# Patient Record
Sex: Female | Born: 1965 | Hispanic: Yes | Marital: Single | Smoking: Never smoker
Health system: Southern US, Community
[De-identification: ages and names within clinical notes are randomized; demographics above are authoritative.]

## PROBLEM LIST (undated history)

## (undated) DIAGNOSIS — O039 Complete or unspecified spontaneous abortion without complication: Secondary | ICD-10-CM

## (undated) DIAGNOSIS — F32A Depression, unspecified: Secondary | ICD-10-CM

## (undated) DIAGNOSIS — G44209 Tension-type headache, unspecified, not intractable: Secondary | ICD-10-CM

## (undated) HISTORY — DX: Tension-type headache, unspecified, not intractable: G44.209

## (undated) HISTORY — DX: Depression, unspecified: F32.A

## (undated) HISTORY — PX: DILATION AND EVACUATION: SHX1459

## (undated) HISTORY — DX: Complete or unspecified spontaneous abortion without complication: O03.9

## (undated) HISTORY — PX: TOTAL ABDOMINAL HYSTERECTOMY: SHX209

---

## 2005-05-24 ENCOUNTER — Other Ambulatory Visit: Admission: RE | Admit: 2005-05-24 | Discharge: 2005-05-24 | Payer: Self-pay | Admitting: Gynecology

## 2005-11-26 HISTORY — PX: PARTIAL HYSTERECTOMY: SHX80

## 2006-05-23 ENCOUNTER — Other Ambulatory Visit: Admission: RE | Admit: 2006-05-23 | Discharge: 2006-05-23 | Payer: Self-pay | Admitting: Gynecology

## 2006-05-30 ENCOUNTER — Encounter (INDEPENDENT_AMBULATORY_CARE_PROVIDER_SITE_OTHER): Payer: Self-pay | Admitting: Specialist

## 2006-05-30 ENCOUNTER — Ambulatory Visit (HOSPITAL_COMMUNITY): Admission: RE | Admit: 2006-05-30 | Discharge: 2006-06-01 | Payer: Self-pay | Admitting: Gynecology

## 2006-07-22 ENCOUNTER — Ambulatory Visit (HOSPITAL_COMMUNITY): Admission: RE | Admit: 2006-07-22 | Discharge: 2006-07-22 | Payer: Self-pay | Admitting: Gynecology

## 2007-05-26 ENCOUNTER — Other Ambulatory Visit: Admission: RE | Admit: 2007-05-26 | Discharge: 2007-05-26 | Payer: Self-pay | Admitting: Gynecology

## 2007-07-24 ENCOUNTER — Ambulatory Visit (HOSPITAL_COMMUNITY): Admission: RE | Admit: 2007-07-24 | Discharge: 2007-07-24 | Payer: Self-pay | Admitting: Gynecology

## 2008-05-26 ENCOUNTER — Other Ambulatory Visit: Admission: RE | Admit: 2008-05-26 | Discharge: 2008-05-26 | Payer: Self-pay | Admitting: Gynecology

## 2008-07-26 ENCOUNTER — Ambulatory Visit (HOSPITAL_COMMUNITY): Admission: RE | Admit: 2008-07-26 | Discharge: 2008-07-26 | Payer: Self-pay | Admitting: Gynecology

## 2009-06-06 ENCOUNTER — Other Ambulatory Visit: Admission: RE | Admit: 2009-06-06 | Discharge: 2009-06-06 | Payer: Self-pay | Admitting: Gynecology

## 2009-06-06 ENCOUNTER — Encounter: Payer: Self-pay | Admitting: Gynecology

## 2009-06-06 ENCOUNTER — Ambulatory Visit: Payer: Self-pay | Admitting: Gynecology

## 2009-07-28 ENCOUNTER — Ambulatory Visit (HOSPITAL_COMMUNITY): Admission: RE | Admit: 2009-07-28 | Discharge: 2009-07-28 | Payer: Self-pay | Admitting: Gynecology

## 2010-06-07 ENCOUNTER — Other Ambulatory Visit: Admission: RE | Admit: 2010-06-07 | Discharge: 2010-06-07 | Payer: Self-pay | Admitting: Gynecology

## 2010-06-07 ENCOUNTER — Ambulatory Visit: Payer: Self-pay | Admitting: Gynecology

## 2010-08-01 ENCOUNTER — Ambulatory Visit (HOSPITAL_COMMUNITY): Admission: RE | Admit: 2010-08-01 | Discharge: 2010-08-01 | Payer: Self-pay | Admitting: Gynecology

## 2010-12-17 ENCOUNTER — Encounter: Payer: Self-pay | Admitting: Gynecology

## 2011-04-13 NOTE — Discharge Summary (Signed)
Elizabeth Hernandez, Elizabeth Hernandez              ACCOUNT NO.:  [id number]   MEDICAL RECORD NO.:  [id number]          PATIENT TYPE:  INP   LOCATION:  9318                          FACILITY:  [facility]   PHYSICIAN:  Juan H. Lily Peer, M.D.DATE OF BIRTH:  10-Oct-1966   DATE OF ADMISSION:  05/30/2006  DATE OF DISCHARGE:  06/01/2006                                 DISCHARGE SUMMARY   TOTAL DAYS HOSPITALIZED:  Two.   HISTORY:  The patient is a 45 year old gravida 3, para 2, ab 1 who on the  morning of July 5th underwent a total abdominal hysterectomy secondary to  symptomatic leiomyomatous uterus (dysmenorrhea, menorrhagia, pelvic pain).  The patient had minimal blood loss during her surgery, received 1300 mL of  lactated Ringer's, had a urine output of 200 mL intraoperatively and  received cefoxitin 2 gm IV.  The specimen, consisting of the uterus and  cervix, weighed 316.5 gm.  For postoperative day #1, her Foley catheter was  discontinued after she had an adequate urine output of more than 600 mL in  an 8-hour period.  Her T max was 98.7.  Hemoglobin and hematocrit were 11.1  and 32.9, respectively, with a platelet count of 246,000.  She was started  on a clear liquid diet.  Her Foley catheter was discontinued.  Her PCA pump  was discontinued.  She was started on p.o. narcotics and started on a clear  liquid diet.  By her second postoperative day, she had tolerated a full  liquid diet and was on a regular diet, was up and ambulating, voiding well,  at the time of this dictation was in the process of taking a shower and was  afebrile with stable vital signs and good bowel sounds.  Her incision was  intact, and she was ready to be discharged home.   FINAL DIAGNOSES:  Symptomatic leiomyomatous uterus.   PROCEDURE PERFORMED:  Total abdominal hysterectomy.   FINAL DISPOSITION/FOLLOWUP:  The patient was discharged home on her second  postoperative day.  She was up, ambulating and tolerating a regular diet  well with stable vital signs and afebrile.  The patient was given a  prescription of Lortab 7.5/500 mg to take 1 p.o. q.4-6h. p.r.n. pain along  with Reglan 10 mg 1 p.o. q.4-6h. p.r.n. nausea or vomiting.  She was to  return back to the office the early part of next week to have her staples  removed.  The instructions were provided in Spanish.  All questions were  answered.  We will follow accordingly.      Juan H. Lily Peer, M.D.  Electronically Signed     JHF/MEDQ  D:  06/01/2006  T:  06/01/2006  Job:  [id number]

## 2011-04-13 NOTE — Op Note (Signed)
Elizabeth Hernandez, Elizabeth Hernandez              ACCOUNT NO.:  [id number]   MEDICAL RECORD NO.:  [id number]          PATIENT TYPE:  AMB   LOCATION:  SDC                           FACILITY:  [facility]   PHYSICIAN:  Juan H. Lily Peer, M.D.DATE OF BIRTH:  09-Feb-1966   DATE OF PROCEDURE:  05/30/2006  DATE OF DISCHARGE:                                 OPERATIVE REPORT   INDICATIONS FOR OPERATION:  45 year old gravida 3, para 2, ab 1 with  symptomatic leiomyomata uteri consisting of dysmenorrhea, menorrhagia, and  pelvic pressure.   PREOPERATIVE DIAGNOSES:  1.  Leiomyomata uteri.  2.  Dysmenorrhea.  3.  Menorrhagia.   POSTOPERATIVE DIAGNOSES:  1.  Leiomyomata uteri.  2.  Dysmenorrhea.  3.  Menorrhagia.   ANESTHESIA:  General endotracheal anesthesia.   PROCEDURE PERFORMED:  Total abdominal hysterectomy.   SURGEON:  Juan H. Lily Peer, M.D.   FIRST ASSISTANT:  Ivor Costa. Lathrop, M.D.   DESCRIPTION OF OPERATION:  After the patient adequately counseled, she was  taken to the operating where she underwent successful general endotracheal  anesthesia. Her abdomen and perineum were prepped and draped in the usual  sterile fashion.  90.25% Marcaine was infiltrated in the planned  Pfannenstiel incision site for total 10 mL.  An incision then was made 2 cm  above the symphysis pubis.  The incision was carried down through the skin,  subcutaneous tissue, down to the rectus fascia where a midline nick was  made.  The fascia was incised in transverse fashion.  The midline raphe was  entered.  The peritoneal cavity was entered cautiously.  The patient was  placed in Trendelenburg position and O'Connor-O'Sullivan retractors were in  place.  Two Heaney clamps were utilized to grasp the uterus on both sides.  It was noted that the patient had an approximately 10 to 11 weeks' size  uterus.  Questionable endometriotic implant the posterior lower uterine  segment.  Both tubes and ovary appeared to be normal otherwise.   The right  round ligament was identified, was cauterized transected.  The bladder flap  was established.  The posterior broad ligament was then penetrated with a  surgeon's finger and a Heaney clamp was used to grasp the utero-ovarian  ligament and fallopian tube proximal to the uterus and it was cut and the  right infundibulopelvic ligament had been secured with 0 Vicryl suture  followed by transfixation stitch.  The remaining parametrium consisting of  the cardinal and broad ligament were serially clamped, cut and suture-  ligated with 0 Vicryl suture to the area of the right vaginal fornix.  Similar procedure was carried out on the contralateral side.  Once this was  accomplished, two curved Heaney clamps were placed at the cervicovaginal  junction.  With the Jorgenson scissors, the cervix was excised from the  vagina and cervix and uterus was passed off the operative field.  The  specimen weighed 316.5 grams.  Both angles were secured with a transfixation  stitch of 0 Vicryl suture and the remaining vaginal cuff was secured with  interrupted sutures of 0 Vicryl suture.  Both ovaries were  individually  suspended to the round ligament with 3-0 Vicryl suture.  The pelvic cavity  was copiously irrigated with normal saline solution and sponge and needle  count were correct.  The patient was reversed from her Trendelenburg  position and the O'Connor-O'Sullivan retractor was removed.  The visceral  peritoneum was not reapproximated but the rectus fascia was closed with  running stitch of zero Vicryl suture.  Subcutaneous bleeders were Bovie  cauterized.  Skin was reapproximated with skin clips followed by placing  Xeroform gauze and 4 x 8 dressing.  The patient was extubated, transferred  to recovery with stable vital signs.  Blood loss from procedure was 50 mL.  IV fluids 1300 mL of lactated Ringer.  Urine output was 200 mL and she  received 2 grams of cefoxitin and the patient also had PSA  stockings for DVT  prophylaxis.      Juan H. Lily Peer, M.D.  Electronically Signed     JHF/MEDQ  D:  05/30/2006  T:  05/30/2006  Job:  [id number]

## 2011-05-31 ENCOUNTER — Encounter: Payer: Self-pay | Admitting: *Deleted

## 2011-06-11 ENCOUNTER — Other Ambulatory Visit (HOSPITAL_COMMUNITY)
Admission: RE | Admit: 2011-06-11 | Discharge: 2011-06-11 | Disposition: A | Discharge status: Home / Self Care | Payer: 59 | Source: Ambulatory Visit | Attending: Gynecology | Admitting: Gynecology

## 2011-06-11 ENCOUNTER — Other Ambulatory Visit: Payer: Self-pay | Admitting: Gynecology

## 2011-06-11 ENCOUNTER — Encounter (INDEPENDENT_AMBULATORY_CARE_PROVIDER_SITE_OTHER): Payer: 59 | Admitting: Gynecology

## 2011-06-11 ENCOUNTER — Encounter: Payer: Self-pay | Admitting: Gynecology

## 2011-06-11 DIAGNOSIS — Z833 Family history of diabetes mellitus: Secondary | ICD-10-CM

## 2011-06-11 DIAGNOSIS — Z01419 Encounter for gynecological examination (general) (routine) without abnormal findings: Secondary | ICD-10-CM

## 2011-06-11 DIAGNOSIS — R635 Abnormal weight gain: Secondary | ICD-10-CM

## 2011-06-11 DIAGNOSIS — B373 Candidiasis of vulva and vagina: Secondary | ICD-10-CM

## 2011-06-11 DIAGNOSIS — Z124 Encounter for screening for malignant neoplasm of cervix: Secondary | ICD-10-CM | POA: Insufficient documentation

## 2011-06-11 DIAGNOSIS — Z13220 Encounter for screening for lipoid disorders: Secondary | ICD-10-CM

## 2011-06-15 ENCOUNTER — Other Ambulatory Visit: Payer: Self-pay | Admitting: Gynecology

## 2011-06-15 DIAGNOSIS — Z1231 Encounter for screening mammogram for malignant neoplasm of breast: Secondary | ICD-10-CM

## 2011-06-19 ENCOUNTER — Telehealth: Payer: Self-pay | Admitting: Anesthesiology

## 2011-06-19 NOTE — Telephone Encounter (Signed)
I called patient today and informed that her pap smear report showed yeast infection was present.Marland Kitchen And wanted to call in a Rx for the infection but she said that Dr. Lily Peer had provided a Rx for diflucan the day of her visit and she felt ok and didn't feel she needed another refill.. All was explained in spanish and advised to call back with any other questions.Marland KitchenMarland Kitchen

## 2011-08-03 ENCOUNTER — Ambulatory Visit (HOSPITAL_COMMUNITY)
Admission: RE | Admit: 2011-08-03 | Discharge: 2011-08-03 | Disposition: A | Discharge status: Home / Self Care | Payer: 59 | Source: Ambulatory Visit | Attending: Gynecology | Admitting: Gynecology

## 2011-08-03 DIAGNOSIS — Z1231 Encounter for screening mammogram for malignant neoplasm of breast: Secondary | ICD-10-CM

## 2011-12-18 ENCOUNTER — Ambulatory Visit (INDEPENDENT_AMBULATORY_CARE_PROVIDER_SITE_OTHER): Payer: 59

## 2011-12-18 DIAGNOSIS — J019 Acute sinusitis, unspecified: Secondary | ICD-10-CM

## 2012-06-11 ENCOUNTER — Encounter: Payer: Self-pay | Admitting: Gynecology

## 2012-06-11 ENCOUNTER — Ambulatory Visit (INDEPENDENT_AMBULATORY_CARE_PROVIDER_SITE_OTHER): Payer: 59 | Admitting: Gynecology

## 2012-06-11 VITALS — BP 116/74 | Ht 60.0 in | Wt 169.0 lb

## 2012-06-11 DIAGNOSIS — N898 Other specified noninflammatory disorders of vagina: Secondary | ICD-10-CM

## 2012-06-11 DIAGNOSIS — R635 Abnormal weight gain: Secondary | ICD-10-CM

## 2012-06-11 DIAGNOSIS — Z01419 Encounter for gynecological examination (general) (routine) without abnormal findings: Secondary | ICD-10-CM

## 2012-06-11 DIAGNOSIS — L293 Anogenital pruritus, unspecified: Secondary | ICD-10-CM

## 2012-06-11 DIAGNOSIS — Z833 Family history of diabetes mellitus: Secondary | ICD-10-CM

## 2012-06-11 LAB — CBC WITH DIFFERENTIAL/PLATELET
Basophils Absolute: 0 10*3/uL (ref 0.0–0.1)
Basophils Relative: 0 % (ref 0–1)
Eosinophils Absolute: 0.2 10*3/uL (ref 0.0–0.7)
MCH: 30.4 pg (ref 26.0–34.0)
MCHC: 33.7 g/dL (ref 30.0–36.0)
Neutrophils Relative %: 51 % (ref 43–77)
Platelets: 263 10*3/uL (ref 150–400)
RBC: 4.21 MIL/uL (ref 3.87–5.11)
RDW: 14.9 % (ref 11.5–15.5)

## 2012-06-11 LAB — CHOLESTEROL, TOTAL: Cholesterol: 163 mg/dL (ref 0–200)

## 2012-06-11 LAB — WET PREP FOR TRICH, YEAST, CLUE
Clue Cells Wet Prep HPF POC: NONE SEEN
Trich, Wet Prep: NONE SEEN
WBC, Wet Prep HPF POC: NONE SEEN
Yeast Wet Prep HPF POC: NONE SEEN

## 2012-06-11 LAB — HEMOGLOBIN A1C: Hgb A1c MFr Bld: 5.8 % — ABNORMAL HIGH (ref <5.7)

## 2012-06-11 NOTE — Patient Instructions (Addendum)
Control del colesterol  Los niveles de colesterol en el organismo estn determinados significativamente por su dieta. Los niveles de colesterol tambin se relacionan con la enfermedad cardaca. El material que sigue ayuda a Software engineer relacin y a Chiropractor qu puede hacer para mantener su corazn sano. No todo el colesterol es [place]. Las lipoprotenas de baja densidad (LDL) forman el colesterol "malo". El colesterol malo puede ocasionar depsitos de grasa que se acumulan en el interior de las arterias. Las lipoprotenas de alta densidad (HDL) es el colesterol "bueno". Ayuda a remover el colesterol LDL "malo" de la [place]. El colesterol es un factor de riesgo muy importante para la enfermedad cardaca. Otros factores de riesgo son la hipertensin arterial, el hbito de fumar, el estrs, la herencia y [place].  El msculo cardaco obtiene el suministro de sangre a travs de las arterias coronarias. Si su colesterol LDL ("malo") est elevado y el HDL ("bueno") es bajo, tiene un factor de riesgo para que se formen depsitos de Holiday representative en las arterias coronarias (los vasos sanguneos que suministran sangre al corazn). Esto hace que haya menos lugar para que la sangre circule. Sin la suficiente sangre y oxgeno, el msculo cardaco no puede funcionar correctamente, y usted podr sentir dolores en el pecho (angina pectoris). Cuando una arteria coronaria se cierra completamente, una parte del msculo cardaco puede morir (infarto de miocardio). CONTROL DEL COLESTEROL Cuando el profesional que lo asiste enva la sangre al laboratorio para Artist nivel de colesterol, puede realizarle tambin un perfil completo de los lpidos. Con esta prueba, se puede determinar la cantidad total de colesterol, as como los niveles de LDL y HDL. Los triglicridos son un tipo de grasa que circula en la sangre y que tambin puede utilizarse para determinar el riesgo de enfermedad  cardaca. En la siguiente tabla se establecen los nmeros ideales: Prueba: Colesterol total  Menos de 200 mg/dl.  Prueba: LDL "colesterol malo"  Menos de 100 mg/dl.   Menos de 70 mg/dl si tiene riesgo muy elevado de sufrir un ataque cardaco o muerte cardaca sbita.  Prueba: HDL "colesterol bueno"  Mujeres: Ms de 50 mg/dl.   Hombres: Ms de 40 mg/dl.  Prueba: Trigliceridos  Menos de 150 mg/dl.  CONTROL DEL COLESTEROL CON DIETA Aunque factores como el ejercicio y el estilo de vida son importantes, la "primera lnea de ataque" es la dieta. Esto se debe a que se sabe que ciertos alimentos hacen subir el colesterol y otros lo [place]. El objetivo debe ser ConAgra Foods alimentos, de modo que tengan un efecto sobre el colesterol y, an ms importante, Microbiologist las grasas saturadas y trans con otros tipos de grasas, como las monoinsaturadas y las poliinsaturadas y cidos grasos omega-3 . En promedio, una persona no debe consumir ms de 15 a 17 g de grasas saturadas por C.H[employer] Worldwide. Las grasas saturadas y trans se consideran grasas "malas", ya que elevan el colesterol LDL. Las grasas saturadas se encuentran principalmente en productos animales como carne, Miles y crema. Pero esto no significa que usted Marketing executive todas sus comidas favoritas. Actualmente, como lo muestra el cuadro que figura al final de este documento, hay sustitutos de buen sabor, bajos en grasas y en colesterol, para la mayora de los alimentos que a usted Musician. Elija aquellos alimentos alternativos que sean bajos en grasas o sin grasas. Elija cortes de carne del cuarto trasero o lomo ya que estos cortes son los que tienen menor cantidad de grasa y Oncologist. El pollo (  sin piel), el pescado, la carne de ternera, y la [place] molida son excelentes opciones. Elimine las carnes Tyson Foods o el salami. Los Federal-Mogul o nada de grasas saturadas. Cuando consuma carne [place], carne de aves de  corral, o pescado, hgalo en porciones de 85 gramos (3 onzas). Las grasas trans tambin se llaman "aceites parcialmente hidrogenados". Son aceites manipulados cientficamente de [place] que son slidos a Publishing rights manager, tienen una larga vida y Glass blower/designer sabor y la textura de los alimentos a los que se Scientist, clinical (histocompatibility and immunogenetics). Las grasas trans se encuentran en la [place], [place], crackers y alimentos horneados.  Para hornear y cocinar, el aceite es un excelente sustituto para la [place]. Los aceites monoinsaturados tienen un beneficio particular, ya que se cree que disminuyen el colesterol LDL (colesterol malo) y elevan el HDL. Deber evitar los aceites tropicales saturados como el de coco y el de [place].  Recuerde, adems, que puede comer sin restricciones los grupos de alimentos que son naturalmente libres de grasas saturadas y Neurosurgeon trans, entre los que se incluyen el pescado, las frutas (excepto el [place]), verduras, frijoles, cereales (cebada, arroz, [place], trigo) y las pastas (sin salsas con crema)  IDENTIFIQUE LOS ALIMENTOS QUE DISMINUYEN EL COLESTEROL  Pueden disminuir el colesterol las fibras solubles que estn en las frutas, como las [place], en los vegetales como el brcoli, las patatas y las zanahorias; en las legumbres como frijoles, guisantes y Therapist, occupational; y en los cereales como la cebada. Los alimentos fortificados con fitosteroles tambin Engineer, production. Debe consumir al menos 2 g de estos alimentos a diario para Financial planner de disminucin de [place].  En el supermercado, lea las etiquetas de los envases para identificar los alimentos bajos en grasas saturadas, libres de grasas trans y bajos en [place], . Elija quesos que tengan solo de 2 a 3 g de grasa saturada por onza (28,35 g). Use una margarina que no dae el corazn, [place] de grasas trans o aceite parcialmente hidrogenado. Al comprar alimentos horneados (galletitas dulces y Gaffer) evite el aceite parcialmente  hidrogenado. Los panes y bollos debern ser de granos enteros (harina de maz o de avena entera, en lugar de "harina" o "harina enriquecida"). Compre sopas en lata que no sean cremosas, con bajo contenido de sal y sin grasas adicionadas.  TCNICAS DE PREPARACIN DE LOS ALIMENTOS  Nunca fra los alimentos en aceite abundante. Si debe frer, hgalo en poco aceite y removiendo [place], porque as se utilizan muy pocas grasas, o utilice un spray antiadherente. Cuando le sea posible, hierva, hornee o ase las carnes y cocine los vegetales al vapor. En vez de Aetna con mantequilla o [place], utilice limn y hierbas, pur de Psychologist, educational y canela (para las calabazas y batatas), yogurt y salsa descremados y aderezos para ensaladas bajos en contenido graso.  BAJO EN GRASAS SATURADAS / SUSTITUTOS BAJOS EN GRASA  Carnes / Grasas saturadas (g)  Evite: Bife, corte graso (3 oz/85 g) / 11 g   Elija: Bife, corte magro (3 oz/85 g) / 4 g   Evite: Hamburguesa (3 oz/85 g) / 7 g   Elija:  [place]amb[place]a magra (3 oz/85 g) / 5 g   Evite: Jamn (3 oz/85 g) / 6 g   Elija:  Jamn magro (3 oz/85 g) / 2.4 g   Evite: Pollo, con piel (3 oz/85 g), Carne oscura / 4 g   Elija:  Pollo, sin piel (3 oz/85 g), Carne oscura / 2  g   Evite: Pollo, con piel (3 oz/85 g), Carne magra / 2.5 g   Elija: Pollo, sin piel (3 oz/85 g), Carne magra / 1 g  Lcteos / Grasas saturadas (g)  Evite: Leche entera (1 taza) / 5 g   Elija: Leche con bajo contenido de grasa, 2% (1 taza) / 3 g   Elija: Leche con bajo contenido de grasa, 1% (1 taza) / 1.5 g   Elija: Leche descremada (1 taza) / 0.3 g   Evite: Queso duro (1 oz/28 g) / 6 g   Elija: Queso descremado (1 oz/28 g) / 2-3 g   Evite: Queso cottage, 4% grasa (1 taza)/ 6.5 g   Elija: Queso cottage con bajo contenido de grasa, 1% grasa (1 taza)/ 1.5 g   Evite: Helado (1 taza) / 9 g   Elija: Sorbete (1 taza) / 2.5 g   Elija: Yogurt helado sin contenido de grasa  (1 taza) / 0.3 g   Elija: Barras de fruta congeladas / vestigios   Evite: Crema batida (1 cucharada) / 3.5 g   Elija: Batidos glac sin lcteos (1 cucharada) / 1 g  Condimentos / Grasas saturadas (g)  Evite: Mayonesa (1 cucharada) / 2 g   Elija: Mayonesa con bajo contenido de grasa (1 cucharada) / 1 g   Evite: [place] (1 cucharada) / 7 g   Elija: Margarina extra light (1 cucharada) / 1 g   Evite: Aceite de coco (1 cucharada) / 11.8 g   Elija: Aceite de oliva (1 cucharada) / 1.8 g   Elija: Aceite de maz (1 cucharada) / 1.7 g   Elija: Aceite de crtamo (1 cucharada) / 1.2 g   Elija: Aceite de girasol (1 cucharada) / 1.4 g   Elija: Aceite de soja (1 cucharada) / 2.4 g   Elija: Aceite de canola (1 cucharada) / 1 g  Document Released: 11/12/2005 Document Revised: 07/25/2011 [facility] Patient Information 2012 [place], [place].  Ejercicios para perder peso (Exercise to Lose Weight) La actividad fsica y Neomia Dear dieta saludable ayudan a perder peso. El mdico podr sugerirle ejercicios especficos. IDEAS Y CONSEJOS PARA HACER EJERCICIOS  Elija opciones econmicas que disfrute hacer , como caminar, andar en bicicleta o los vdeos para ejercitarse.   Utilice las Microbiologist del ascensor.   Camine durante la hora del almuerzo.   Estacione el auto lejos del lugar de [place] o [place].   Concurra a un gimnasio o tome clases de gimnasia.   Comience con 5  10 minutos de actividad fsica por da. Ejercite hasta 30 minutos, 4 a 6 das por [address]   Utilice zapatos que tengan un buen soporte y ropas cmodas.   Elongue antes y despus de Company secretary.   Ejercite hasta que aumente la respiracin y el corazn palpite rpido.   Beba agua extra cuando ejercite.   No haga ejercicio Firefighter, sentirse mareado o que le falte mucho el aire.  La actividad fsica puede quemar alrededor de 150 caloras.  Correr 20 cuadras en 15 minutos.   Jugar vley durante 45 a 60 minutos.     Limpiar y encerar el auto durante 45 a 60 minutos.   Jugar ftbol americano de toque.   Caminar 25 cuadras en 35 minutos.   Empujar un cochecito 20 cuadras en 30 minutos.   Jugar baloncesto durante 30 minutos.   Rastrillar hojas secas durante 30 minutos.   Andar en bicicleta 80 cuadras en 30 minutos.   Caminar 30 cuadras en  30 minutos.   Bailar durante 30 minutos.   Quitar la nieve con una pala durante 15 minutos.   Nadar vigorosamente durante 20 minutos.   Subir escaleras durante 15 minutos.   Andar en bicicleta 60 cuadras durante 15 minutos.   Arreglar el jardn entre 30 y 45 minutos.   Saltar a la soga durante 15 minutos.   Limpiar vidrios o pisos durante 45 a 60 minutos.  Document Released: 02/16/2011 Document Revised: 07/25/2011 [facility] Patient Information 2012 [place], [place]. Mantenimiento de Engineer, maintenance (IT) las mujeres (Health Maintenance, Females) Un estilo de vida saludable y los cuidados preventivos pueden favorecer la salud y [place].   Haga exmenes regulares de la salud en general, dentales y de los ojos.   Consuma una dieta saludable. Los Sun Microsystems, frutas, granos enteros, productos lcteos descremados y protenas magras contienen los nutrientes que usted necesita sin necesidad de consumir muchas caloras. Disminuya el consumo de alimentos con alto contenido de grasas slidas, azcar y sal agregadas. Si es necesario, pdaleinformacin acerca de una dieta [place] a su mdico.   La actividad fsica regular es una de las cosas ms importantes que puede hacer por su salud. Los adultos deben hacer al menos 150 minutos de ejercicios de intensidad moderada (cualquier actividad que aumente la frecuencia cardaca y lo haga transpirar) cada semana. Adems, la Harley-Davidson de los adultos necesita ejercicios de fortalecimiento muscular 2  ms Eli Lilly and Company.    Mantenga un peso saludable. El ndice de masa corporal [facility]) es una herramienta que  identifica posibles problemas con [place]. Proporciona una estimacin de la grasa corporal basndose en el peso y la altura. El mdico podr determinar su [facility] y podr ayudarlo a Personnel officer o Pharmacologist un peso saludable. Para los adultos de 20 aos o ms:   Un [facility] menor a 18,5 se considera bajo peso.   Un [facility] entre 18,5 y 24,9 es normal.   Un [facility] entre 25 y 29,9 es sobrepeso.   Un IMC entre 30 o ms es obesidad.   Mantenga un nivel normal de lpidos y colesterol en sangre practicando actividad fsica y minimizando la ingesta de grasas saturadas. Consuma una dieta balanceada e incluya variedad de frutas y vegetales. Los ARAMARK Corporation de lpidos y Oncologist en sangre deben Games developer a los 20 aos y repetirse cada 5 aos. Si los niveles de colesterol son altos, tiene ms de 50 aos o tiene riesgo elevado de sufrir enfermedades cardacas, Pension scheme manager controlarse con ms frecuencia.Si tiene Ryerson Inc de lpidos y colesterol, debe recibir tratamiento con medicamentos, si la dieta y el ejercicio no son efectivos.   Si fuma, consulte con [place] [employer] de las opciones para dejar de Century. Si no lo hace, no comience.   Si est embarazada no beba alcohol. Si est amamantando, beba alcohol con prudencia. Si elige beber alcohol, no se exceda de 1 medida por da. Se considera una medida a 12 onzas (355 ml) de cerveza, 5 onzas (148 ml) de vino, o 1,5 onzas (44 ml) de licor.   Evite el alcohol y el consumo de drogas. No comparta agujas. Pida ayuda si necesita asistencia o instrucciones con respecto a abandonar el consumo de alcohol, cigarrillos o drogas.   La hipertensin arterial causa enfermedades cardacas y [place] el riesgo de ictus. Debe controlar su presin arterial al menos cada 1 o 2 aos. La presin arterial elevada que persiste debe tratarse con medicamentos si la prdida de peso y el ejercicio no son efectivos.  Si tiene entre 55 y 78 aos, consulte a su mdico si debe tomar aspirina para  prevenir enfermedades cardacas.   Los anlisis para la diabetes incluyen la toma de [place] de sangre para controlar el nivel de azcar en la sangre durante el [place]. Debe hacerlo cada 3 aos despus de los 45 aos si est dentro de su peso normal y sin factores de riesgo para la diabetes. Las pruebas deben comenzar a edades tempranas o llevarse a cabo con ms frecuencia si tiene sobrepeso y al menos 1 factor de riesgo para la diabetes.   Las evaluaciones para Market researcher de mama son un mtodo preventivo fundamental para las mujeres. Debe practicar la "autoconciencia de las mamas". Esto significa que Product/process development scientist apariencia normal de sus mamas y [employer] siente y pudiendo incluir un autoexamen de Building control surveyor. Si detecta algn cambio, no importa cun pequeo sea, debe informarlo a su mdico. Las mujeres entre 20 y 40 aos deben hacer un examen clnico de las mamas como parte del examen regular de [place], cada 1 a 3 aos. Despus de los 40 aos deben Sprint Nextel Corporation. Deben hacerse una mamografa radografa de mamas ) cada ao, comenzando a los 40 aos. Las mujeres con Brewing technologist de cncer de mama deben hablar con el mdico para hacer un estudio gentico. Las que tienen ms riesgo deben Geographical information systems officer resonancia magntica y [employer] Services todos [place].   Un test de Pap se realiza para diagnosticar cncer de cuello de tero. Las mujeres deben E[employer] un test de Pap a partir de los [address] Dynegy 21 y los 29 aos debe repetirse [address] Luego de los 30 aos, debe realizarse un test de Pap cada tres aos siempre que los 3 estudios anteriores sean normales. Si le han realizado una histerectoma por un problema que no era cncer u otra enfermedad que podra causar cncer, ya no necesitar un test de Pap. Si tiene entre 65 y 60 aos y ha tenido Scientist, research (physical sciences) de Pap normal en los ltimos 10 aos, ya no ser Music therapist. Si ha recibido un tratamiento para Management consultant cervical o para una  enfermedad que podra causar cncer, Musician un test de Pap y controles durante al menos 20 aos de concluir el [place]. Si no se ha Patent attorney con regularidad, Writer a evaluarse los factores de riesgo (como el tener un nuevo compaero sexual) para Occupational psychologist a Printmaker. Algunas mujeres sufren problemas mdicos que aumentan la probabilidad de Research officer, political party cervical. En estos casos, el mdico podr indicar que se realice el test de Pap con ms frecuencia.   La prueba del virus del Geneticist, molecular (VPH) es un anlisis adicional que puede usarse para Engineer, site de cuello de tero. Esta prueba busca la presencia del virus que causa los cambios en el cuello. Las clulas que se recolectan durante el test de Pap pueden usarse para el VPH. La prueba para el VPH puede usarse para Development worker, community a mujeres de ms de 30 aos y debe usarse en mujeres de cualquier edad Cisco del test de Pap no sean claros. Despus de los 30 aos, las mujeres deben hacerse el anlisis para el VPH con la misma frecuencia que el test de Pap.   El cncer colorectal puede detectarse y con frecuencia puede prevenirse. La mayor parte de los estudios de rutina comienzan a los 50 aos y [place]orne 75 aos. Sin  embargo, el mdico podr aconsejarle que lo haga antes, si tiene factores de riesgo para Management consultant de colon. Una vez por ao, el profesional le dar un kit de prueba para Scientist, product/process development en la materia fecal. La utilizacin de un tubo con una pequea cmara en su extremo para examinar directamente el colon (sigmoidoscopa o colonoscopa), puede detectar formas temprana de cncer colorectal. Hable con su mdico si tiene 50 aos, cuando comience con los estudios de [place]. El examen directo del colon debe repetirse cada 5 a 10 aos, hasta los 75 aos, excepto que se encuentren formas tempranas de plipos precancerosos o pequeos bultos.   Se recomienda realizar  un anlisis de sangre para Engineer, manufacturing hepatitis C a todas las personas [address] 1944 y 1964, y a todo aquel que tenga un riesgo conocido de haber contrado esta enfermedad.   Practique el sexo seguro. Use condones y evite las prcticas sexuales riesgosas para disminuir el contagio de enfermedades de transmisin sexual. Las mujeres sexualmente activas de 25 aos o menos deben controlarse para descartar clamidia, que es una infeccin de transmisin sexual frecuente. Las Coca Cola que tengan mltiples compaeros tambin deben hacerse el anlisis para Engineer, manufacturing clamidia. Se recomienda realizar anlisis para detectar otras enfermedades de transmisin sexual si es sexualmente [place] y tiene riesgos.   La osteoporosis es una enfermedad en la que los huesos pierden los minerales y la fuerza por el avance de la edad. El resultado pueden ser fracturas graves en los [place]. El riesgo de osteoporosis puede identificarse con Neomia Dear prueba de densidad sea. Las mujeres de ms de 65 aos y las que tengan riesgos de sufrir fracturas u osteoporosis deben pedir consejo a su mdico. Consulte a su mdico si debe tomar un suplemento de calcio o de vitamina D para reducir el riesgo de osteoporosis.   La menopausia se asocia a sntomas y riesgos fsicos. Se dispone de una terapia de reemplazo hormonal para disminuir los sntomas y [place]. Consulte a su mdico para saber si la terapia de reemplazo hormonal es conveniente para usted.   Use una pantalla solar con un factor SPF de 30 o mayor. Aplique pantalla de Pietro Cassis y repetida a lo largo del Futures trader. Pngase al resguardo del sol cuando la sombra sea ms pequea que usted. Protjase usando mangas y Automatic Data, un sombrero de ala ancha y gafas para el sol todo el ao, siempre que se encuentre en el exterior.   Informe a su mdico si aparecen nuevos lunares o los que tiene se modifican, especialmente en forma y color. Tambin notifique al mdico si un lunar es ms  grande que el tamao de una goma de Paramedic.   Mantngase al da con las vacunas.  Document Released: 11/01/2011 [facility] Patient Information 2012 [place], [place].    Los productos que pue usar cuando tenga relaccion para ayudar con la sequera son: Lonni Fix

## 2012-06-11 NOTE — Progress Notes (Signed)
Elizabeth Hernandez [birth year] [id number]   History:    46 y.o.  for annual gyn exam a few was complaining of vaginal pruritus as well as weight gain. Patient has a past history of total abdominal hysterectomy. She does have a strong family history diabetes. Her last mammogram was in September 2012 which was normal.  Past medical history,surgical history, family history and social history were all reviewed and documented in the EPIC chart.  Gynecologic History No LMP recorded. Patient has had a hysterectomy. Contraception: Hysterectomy Last Pap:  2012. Results were: normal Last mammogram: 2012. Results were: normal  Obstetric History OB History    Grav Para Term Preterm Abortions TAB SAB Ect Mult Living   4 2 2  2     2      # Outc Date GA Lbr Len/2nd Wgt Sex Del Anes PTL Lv   1 TRM     M SVD  No Yes   2 TRM     F SVD  No Yes   3 ABT            4 ABT                ROS: A ROS was performed and pertinent positives and negatives are included in the history.  GENERAL: No fevers or chills. HEENT: No change in vision, no earache, sore throat or sinus congestion. NECK: No pain or stiffness. CARDIOVASCULAR: No chest pain or pressure. No palpitations. PULMONARY: No shortness of breath, cough or wheeze. GASTROINTESTINAL: No abdominal pain, nausea, vomiting or diarrhea, melena or bright red blood per rectum. GENITOURINARY: No urinary frequency, urgency, hesitancy or dysuria. MUSCULOSKELETAL: No joint or muscle pain, no back pain, no recent trauma. DERMATOLOGIC: No rash, no itching, no lesions. ENDOCRINE: No polyuria, polydipsia, no heat or cold intolerance. No recent change in weight. HEMATOLOGICAL: No anemia or easy bruising or bleeding. NEUROLOGIC: No headache, seizures, numbness, tingling or weakness. PSYCHIATRIC: No depression, no loss of interest in normal activity or change in sleep pattern.     Exam: chaperone present  BP 116/74  Ht 5' (1.524 m)  Wt 169 lb (76.658 kg)  BMI 33.01  kg/m2  Body mass index is 33.01 kg/(m^2).  General appearance : Well developed well nourished female. No acute distress HEENT: Neck supple, trachea midline, no carotid bruits, no thyroidmegaly Lungs: Clear to auscultation, no rhonchi or wheezes, or rib retractions  Heart: Regular rate and rhythm, no murmurs or gallops Breast:Examined in sitting and supine position were symmetrical in appearance, no palpable masses or tenderness,  no skin retraction, no nipple inversion, no nipple discharge, no skin discoloration, no axillary or supraclavicular lymphadenopathy Abdomen: no palpable masses or tenderness, no rebound or guarding Extremities: no edema or skin discoloration or tenderness  Pelvic:  Bartholin, Urethra, Skene Glands: Within normal limits             Vagina: No gross lesions or discharge  Cervix: Absent Uterus absent  Adnexa  Without masses or tenderness  Anus and perineum  normal   Rectovaginal  normal sphincter tone without palpated masses or tenderness             Hemoccult not done   Wet prep negative  Assessment/Plan:  46 y.o. female for annual exam with vaginal dryness especially during intercourse. Her wet prep was negative. She's in a monogamous relationship. I've given her some names of over the counter products such as Astroglide, refresh or Luvena. The following labs were ordered today: Hemoglobin A1c,  cholesterol, TSH, CBC and urinalysis. She was reminded to followup with her mammogram in September this year. She was reminded to do her monthly self breast examination. We discussed importance of calcium vitamin D for osteoporosis prevention. New Pap smear screening guidelines discussed. Patient would know prior history of abnormal Pap smear. No Pap smear done today.,    Ok Edwards MD, 5:17 PM 06/11/2012

## 2012-06-12 ENCOUNTER — Other Ambulatory Visit: Payer: Self-pay | Admitting: Anesthesiology

## 2012-06-12 DIAGNOSIS — R7309 Other abnormal glucose: Secondary | ICD-10-CM

## 2012-06-12 DIAGNOSIS — E039 Hypothyroidism, unspecified: Secondary | ICD-10-CM

## 2012-06-12 NOTE — Addendum Note (Signed)
Addended by: Rushie Goltz on: 06/12/2012 08:23 AM   Modules accepted: Orders

## 2012-06-12 NOTE — Addendum Note (Signed)
Addended by: Bertram Savin A on: 06/12/2012 08:20 AM   Modules accepted: Orders

## 2012-06-13 LAB — URINALYSIS W MICROSCOPIC + REFLEX CULTURE
Bilirubin Urine: NEGATIVE
Glucose, UA: NEGATIVE mg/dL
Ketones, ur: NEGATIVE mg/dL
Protein, ur: NEGATIVE mg/dL
Urobilinogen, UA: 0.2 mg/dL (ref 0.0–1.0)

## 2012-06-14 LAB — URINE CULTURE: Colony Count: NO GROWTH

## 2012-07-08 ENCOUNTER — Other Ambulatory Visit: Payer: 59

## 2012-07-08 DIAGNOSIS — R7309 Other abnormal glucose: Secondary | ICD-10-CM

## 2012-07-08 DIAGNOSIS — E039 Hypothyroidism, unspecified: Secondary | ICD-10-CM

## 2012-07-08 LAB — THYROID PANEL WITH TSH
Free Thyroxine Index: 2.8 (ref 1.0–3.9)
T3 Uptake: 33 % (ref 22.5–37.0)
T4, Total: 8.6 ug/dL (ref 5.0–12.5)
TSH: 3.402 u[IU]/mL (ref 0.350–4.500)

## 2012-08-22 ENCOUNTER — Other Ambulatory Visit: Payer: Self-pay | Admitting: Gynecology

## 2012-08-22 DIAGNOSIS — Z1231 Encounter for screening mammogram for malignant neoplasm of breast: Secondary | ICD-10-CM

## 2012-09-03 ENCOUNTER — Ambulatory Visit (HOSPITAL_COMMUNITY)
Admission: RE | Admit: 2012-09-03 | Discharge: 2012-09-03 | Disposition: A | Discharge status: Home / Self Care | Payer: 59 | Source: Ambulatory Visit | Attending: Gynecology | Admitting: Gynecology

## 2012-09-03 DIAGNOSIS — Z1231 Encounter for screening mammogram for malignant neoplasm of breast: Secondary | ICD-10-CM | POA: Insufficient documentation

## 2013-06-12 ENCOUNTER — Ambulatory Visit (INDEPENDENT_AMBULATORY_CARE_PROVIDER_SITE_OTHER): Payer: 59 | Admitting: Gynecology

## 2013-06-12 ENCOUNTER — Encounter: Payer: 59 | Admitting: Gynecology

## 2013-06-12 ENCOUNTER — Encounter: Payer: Self-pay | Admitting: Gynecology

## 2013-06-12 VITALS — BP 122/78 | Ht 59.75 in | Wt 173.0 lb

## 2013-06-12 DIAGNOSIS — Z01419 Encounter for gynecological examination (general) (routine) without abnormal findings: Secondary | ICD-10-CM

## 2013-06-12 DIAGNOSIS — Z23 Encounter for immunization: Secondary | ICD-10-CM

## 2013-06-12 DIAGNOSIS — Z833 Family history of diabetes mellitus: Secondary | ICD-10-CM

## 2013-06-12 DIAGNOSIS — R635 Abnormal weight gain: Secondary | ICD-10-CM

## 2013-06-12 LAB — CBC WITH DIFFERENTIAL/PLATELET
Basophils Absolute: 0 10*3/uL (ref 0.0–0.1)
Eosinophils Absolute: 0.3 10*3/uL (ref 0.0–0.7)
Eosinophils Relative: 6 % — ABNORMAL HIGH (ref 0–5)
Lymphocytes Relative: 41 % (ref 12–46)
MCH: 30.2 pg (ref 26.0–34.0)
MCV: 90.2 fL (ref 78.0–100.0)
Neutrophils Relative %: 46 % (ref 43–77)
Platelets: 267 10*3/uL (ref 150–400)
RDW: 14.8 % (ref 11.5–15.5)
WBC: 4.7 10*3/uL (ref 4.0–10.5)

## 2013-06-12 NOTE — Progress Notes (Signed)
Elizabeth Hernandez Jul 19, 1966 [id number]   History:    47 y.o.  for annual gyn exam with the only complaint of occasional slight tenderness over the right breast. Patient denies any masses palpated. Her mammogram was normal in 2013 with the exception of increased density. Patient has always had normal Pap smears. Patient with strong family history diabetes. Patient with prior history of abdominal hysterectomy. Patient complaining of weight gain.  Past medical history,surgical history, family history and social history were all reviewed and documented in the EPIC chart.  Gynecologic History No LMP recorded. Patient has had a hysterectomy. Contraception: status post hysterectomy Last Pap: 2012. Results were: normal Last mammogram: 2013. Results were: dense but normal  Obstetric History OB History   Grav Para Term Preterm Abortions TAB SAB Ect Mult Living   4 2 2  2     2      # Outc Date GA Lbr Len/2nd Wgt Sex Del Anes PTL Lv   1 TRM     M SVD  No Yes   2 TRM     F SVD  No Yes   3 ABT            4 ABT                ROS: A ROS was performed and pertinent positives and negatives are included in the history.  GENERAL: No fevers or chills. HEENT: No change in vision, no earache, sore throat or sinus congestion. NECK: No pain or stiffness. CARDIOVASCULAR: No chest pain or pressure. No palpitations. PULMONARY: No shortness of breath, cough or wheeze. GASTROINTESTINAL: No abdominal pain, nausea, vomiting or diarrhea, melena or bright red blood per rectum. GENITOURINARY: No urinary frequency, urgency, hesitancy or dysuria. MUSCULOSKELETAL: No joint or muscle pain, no back pain, no recent trauma. DERMATOLOGIC: No rash, no itching, no lesions. ENDOCRINE: No polyuria, polydipsia, no heat or cold intolerance. No recent change in weight. HEMATOLOGICAL: No anemia or easy bruising or bleeding. NEUROLOGIC: No headache, seizures, numbness, tingling or weakness. PSYCHIATRIC: No depression, no loss of  interest in normal activity or change in sleep pattern.     Exam: chaperone present  BP 122/78  Ht 4' 11.75" (1.518 m)  Wt 173 lb (78.472 kg)  BMI 34.05 kg/m2  Body mass index is 34.05 kg/(m^2).  General appearance : Well developed well nourished female. No acute distress HEENT: Neck supple, trachea midline, no carotid bruits, no thyroidmegaly Lungs: Clear to auscultation, no rhonchi or wheezes, or rib retractions  Heart: Regular rate and rhythm, no murmurs or gallops Breast:Examined in sitting and supine position were symmetrical in appearance, no palpable masses or tenderness,  no skin retraction, no nipple inversion, no nipple discharge, no skin discoloration, no axillary or supraclavicular lymphadenopathy Abdomen: no palpable masses or tenderness, no rebound or guarding Extremities: no edema or skin discoloration or tenderness  Pelvic:  Bartholin, Urethra, Skene Glands: Within normal limits             Vagina: No gross lesions or discharge  Cervix: absent  Uterus absent Adnexa  Without masses or tenderness  Anus and perineum  normal   Rectovaginal  normal sphincter tone without palpated masses or tenderness             Hemoccult not indicated     Assessment/Plan:  47 y.o. female for annual exam which was normal. We discussed importance of monthly supper as examination. Patient to schedule her mammogram and I have recommended she have 3-D due  to the fact that last mammogram 1 year ago with very dense although normal. She is fasting today so a fasting lipid profile, comprehensive metabolic panel, along with CBC and urinalysis and TSH was obtained today. No Pap smear done today the new guidelines were discussed. We discussed importance of monthly self breast examination. We discussed importance of calcium, vitamin D and regular exercise for osteoporosis prevention. Patient not certain on Tdap status ligature information was provided and she will let us know if she has received the in  the past 2 years or not.    Ok Edwards MD, 12:11 PM 06/12/2013

## 2013-06-12 NOTE — Patient Instructions (Addendum)
Vacuna difteria, tétanos, tos ferina (DTP) - Lo que debe saber   (Tetanus, Diphtheria, Pertussis [Tdap] Vaccine, What You Need to Know)  ¿PORQUÉ VACUNARSE?   El tétanos, la difteria y la tos ferina pueden ser enfermedades muy graves, aún en adolescentes y adultos. La vacuna Tdap nos puede proteger de estas enfermedades.   El TÉTANOS (Trismo) provoca la contracción dolorosa de los músculos, por lo general, en todo el cuerpo.   · Puede causar el endurecimiento de los músculos de la cabeza y el cuello, de modo que impide abrir la boca, tragar y en algunos casos, respirar. El tétanos causa la muerte de 1 de cada 5 personas que se infectan.  La DIFTERIA produce la formación de una membrana gruesa que cubre el fondo de la garganta.   · Puede causar problemas respiratorios, parálisis, insuficiencia cardíaca e incluso la muerte.  TOS FERINA (Pertusis) causa episodios de tos graves, que pueden hacer difícil la respiración, causar vómitos y trastornos del sueño.   · También puede ser la causa de pérdida de peso, incontinencia y fractura de costillas. Dos de cada 100 adolescentes y cinco de cada 100 adultos que enferman de pertusis deben ser hospitalizados, tienen complicaciones como la neumonía o mueren.  Estas enfermedades son provocadas por bacterias. La difteria y el pertusis se contagian de persona a persona a través de la tos o el estornudo. El tétanos ingresa al organismo a través de cortes, rasguños o heridas.   Antes de las vacunas, en los [place] se vieron más de 200.000 casos al año de difteria y tos ferina y cientos de casos de tétanos. Desde el inicio de la vacunación, los casos de tétanos y difteria han disminuido alrededor del 99% y los casos de tos ferina alrededor del 80%.   Tdap   La vacuna Tdap protege a adolescentes y adultos contra el tétanos, la difteria y la tos ferina. Una dosis de Tdap se administra a los 11 o 12 años de edad. Las personas que no recibieron la vacuna Tdap a esa edad deben  recibirla tan pronto como sea posible.   Es muy importante que los profesionales de la salud y todos aquellos que tengan contacto cercano con bebés menores de 12 meses reciban la Tdap.   Las mujeres embarazadas deben recibir una dosis de Tdap en cada embarazo, para proteger al recién nacido de la tos ferina. Los niños tienen mayor riesgo de complicaciones graves y potencialmente mortales debido a la tos ferina.   Una vacuna similar, llamada Td, protege contra el tétanos y la difteria, pero no contra la tos ferina. Cada 10 años debe recibirse un refuerzo de Td. La Tdap se puede administrar como uno de estos refuerzos, si todavía no ha recibido una dosis. También se puede aplicar después de un corte o quemadura grave para prevenir la infección por tétanos.   El médico le dará más información.   La Tdap puede administrarse de manera segura simultáneamente con otras vacunas.   ALGUNAS PERSONAS NO DEBEN RECIBIR ESTA VACUNA.   · Si alguna vez tuvo una reacción alérgica potencialmente mortal después de una dosis de la vacuna contra el tétanos, la diferia o la tos ferina, o tuvo una alergia grave a cualquiera de los componentes de esta vacuna, no debe aplicarse la vacuna. Informe a su médico si usted sufre algún tipo de alergia grave.  · Si estuvo en coma o sufrió múltiples convulsiones dentro de los 7 días posteriores después de una dosis de DTP o DTaP   no debe recibir la Tdap, salvo que se encuentre otra causa En este caso puede recibir la Td.  · Consulte con su médico si:  · tiene epilepsia u otra enfermedad del sistema nervioso,  · siente dolor intenso o se hincha después de recibir cualquier vacuna contra la difteria, el tétanos o la tos ferina,  · alguna vez ha sufrido el síndrome de Guillain-Barré,  · no se siente bien el día en que se ha programado la vacuna.  RIESGOS DE UNA REACCIÓN A LA VACUNA  Con cualquier medicamento, incluyendo las vacunas, existe la posibilidad de que aparezcan efectos secundarios. Estos son  leves y desaparecen por sí solos, pero también son posibles las reacciones graves.   Breves episodios de desmayo pueden seguir a una vacunación, causando lesiones por la caída. Sentarse o recostarse durante 15 minutos puede ayudar a evitarlo. Informe al médico si se siente mareado o aturdido, tiene cambios en la visión o zumbidos en los oídos.   Problemas leves luego de la Tdap (no interferirán con las actividades)   · Dolor en el sitio de la inyección (alrededor de 1 de cada 4 adolescentes o 2 de cada 3 adultos).  · Enrojecimiento o hinchazón en el lugar de la inyección (1 de cada 5 personas).  · Fiebre leve de al menos 100,4° F (38° C) (hasta alrededor de 1 cada 25 adolescentes y 1 de cada 100 adultos).  · Dolor de cabeza (3 o 4de cada 10 personas).  · Cansancio (1 de cada 3 o 4 personas).  · Náuseas, vómitos, diarrea, dolor de estómago (1 de cada 4 adolescentes o 1 de cada 10 adultos).  · Escalofríos, dolores corporales, dolor articular, erupciones, inflamación de las glándulas (poco frecuente).  Problemas moderados: (interfieren con las actividades, pero no requieren atención médica)   · Dolor en el lugar de la inyección (1 de cada 5 adolescentes o 1 de cada 100 adultos).  · Enrojecimiento o inflamación (1 de cada 16 adolescentes y 1 de cada 25 adultos).  · Fiebre de más de 102°F o 38,9°C (1 de cada 100 adolescentes o 1 de cada 250 adultos).  · Dolor de cabeza (alrededor de 4 de cada 20 adolescentes y 3 de cada 10 adultos).  · Náuseas, vómitos, diarrea, dolor de estómago (1 a 3 de cada 100 personas).  · Hinchazón de todo el brazo en el que se aplicó la vacuna (3 de cada100 personas).  Problemas graves: luego de la Tdap (no puede realizar las actividades habituales, requiere atención médica)   · Inflamación, dolor intenso, sangrado y enrojecimiento en el brazo, en el sitio de la inyección (poco frecuente).  Una reacción alérgica grave puede ocurrir después de la administración de cualquier vacuna (se estima en  menos de 1 en un millón de dosis).   ¿QUÉ PASA SI HAY UNA REACCIÓN GRAVE?   ¿Qué signos debo buscar?  · Observe todo lo que le preocupe, como signos de una reacción alérgica grave, fiebre muy alta o cambios en el comportamiento.  Los signos de una reacción alérgica grave pueden incluir urticaria, hinchazón de la cara y la garganta, dificultad para respirar, ritmo cardíaco acelerado, mareos y debilidad. Estos síntomas pueden comenzar entre unos pocos minutos y algunas horas después de la vacunación.   ¿Qué debo hacer?  · Si usted piensa que se trata de una reacción alérgica grave o de otra emergencia que no puede esperar, llame al 911 o lleve a la persona al hospital más cercano. De lo contrario, llame   a su médico.  · Después, la reacción debe informarse a la "Vaccine Adverse Event Reporting System" (Sistema de información sobre efectos adversos de las vacunas -VAERS). El médico o usted mismo pueden realizar el informe en el sitio web del VAERS www.vaers.hhs.govo llame al 1-800-822-7967.  El VAERS es sólo para informar reacciones. No brindan consejo médico.   PROGRAMA NACIONAL DE COMPENSACIÓN DE DAÑOS POR VACUNAS   El National Vaccine Injury Compensation Program (VICP) es un programa federal que fue creado para compensar a las personas que puedan haber sufrido daños al recibir ciertas vacunas.   Aquellas personas que consideren que han sufrido un daño como consecuencia de una vacuna y quieren saber más acerca del programa y como presentar una denuncia, pueden llamar 1-800-338-2382 o visite el sitio web del VICP en www.hrsa.gov/vaccinecompensation.   ¿CÓMO PUEDO OBTENER MÁS INFORMACIÓN?   · Consulte a su médico.  · Comuníquese con el servicio de salud de su localidad o su estado.  · Comuníquese con los Centros para el control y la prevención de enfermedades (Centers for Disease Control and Prevention , CDC).  · llamando al 1-800-232-4636 o visitando el sitio web del CDC en www.cdc.gov/vaccines.  CDC Tdap Vaccine VIS  (04/03/12)   Document Released: 10/29/2012  [employer]® Patient Information ©2014 [employer].

## 2013-06-12 NOTE — Addendum Note (Signed)
Addended by: Bertram Savin A on: 06/12/2013 12:39 PM   Modules accepted: Orders

## 2013-06-13 LAB — URINALYSIS W MICROSCOPIC + REFLEX CULTURE
Hgb urine dipstick: NEGATIVE
Leukocytes, UA: NEGATIVE
Nitrite: NEGATIVE
Protein, ur: NEGATIVE mg/dL

## 2013-06-13 LAB — LIPID PANEL
HDL: 43 mg/dL (ref >39)
LDL Cholesterol: 126 mg/dL — ABNORMAL HIGH (ref 0–99)
Total CHOL/HDL Ratio: 4.3 Ratio
VLDL: 15 mg/dL (ref 0–40)

## 2013-06-13 LAB — COMPREHENSIVE METABOLIC PANEL
AST: 18 U/L (ref 0–37)
Alkaline Phosphatase: 47 U/L (ref 39–117)
BUN: 15 mg/dL (ref 6–23)
Creat: 0.75 mg/dL (ref 0.50–1.10)

## 2013-06-16 LAB — URINE CULTURE: Colony Count: 25000

## 2013-06-17 ENCOUNTER — Encounter: Payer: Self-pay | Admitting: Gynecology

## 2013-06-19 ENCOUNTER — Other Ambulatory Visit: Payer: Self-pay | Admitting: Gynecology

## 2013-06-19 MED ORDER — NITROFURANTOIN MONOHYD MACRO 100 MG PO CAPS: 100.0000 mg | ORAL_CAPSULE | Freq: Two times a day (BID) | ORAL | Status: DC

## 2013-07-20 ENCOUNTER — Other Ambulatory Visit: Payer: Self-pay

## 2013-07-20 DIAGNOSIS — Z1231 Encounter for screening mammogram for malignant neoplasm of breast: Secondary | ICD-10-CM

## 2013-09-04 ENCOUNTER — Ambulatory Visit: Payer: 59

## 2013-09-30 ENCOUNTER — Ambulatory Visit
Admission: RE | Admit: 2013-09-30 | Discharge: 2013-09-30 | Disposition: A | Discharge status: Home / Self Care | Payer: 59 | Source: Ambulatory Visit

## 2013-09-30 DIAGNOSIS — Z1231 Encounter for screening mammogram for malignant neoplasm of breast: Secondary | ICD-10-CM

## 2014-06-14 ENCOUNTER — Encounter: Payer: 59 | Admitting: Gynecology

## 2014-07-01 ENCOUNTER — Ambulatory Visit (INDEPENDENT_AMBULATORY_CARE_PROVIDER_SITE_OTHER): Payer: 59 | Admitting: Gynecology

## 2014-07-01 ENCOUNTER — Encounter: Payer: Self-pay | Admitting: Gynecology

## 2014-07-01 VITALS — BP 122/80 | Ht 60.0 in | Wt 174.0 lb

## 2014-07-01 DIAGNOSIS — K219 Gastro-esophageal reflux disease without esophagitis: Secondary | ICD-10-CM

## 2014-07-01 DIAGNOSIS — R1011 Right upper quadrant pain: Secondary | ICD-10-CM

## 2014-07-01 DIAGNOSIS — Z01419 Encounter for gynecological examination (general) (routine) without abnormal findings: Secondary | ICD-10-CM

## 2014-07-01 MED ORDER — NYSTATIN-TRIAMCINOLONE 100000-0.1 UNIT/GM-% EX CREA: 1.0000 "application " | TOPICAL_CREAM | Freq: Three times a day (TID) | CUTANEOUS | Status: DC

## 2014-07-01 MED ORDER — OMEPRAZOLE 20 MG PO CPDR: 20.0000 mg | DELAYED_RELEASE_CAPSULE | Freq: Every day | ORAL | Status: DC

## 2014-07-01 NOTE — Patient Instructions (Addendum)
Ecografa abdominal o plvica  (Abdominal or Pelvic Ultrasound)  La ecografa Botswanausa ondas sonoras inofensivas en lugar de rayos x para tomar imgenes del interior del cuerpo. Un dispositivo con forma de probeta o micrfono (transductor) se apoya contra el cuerpo para captar estas imgenes. Las imgenes cambiantes pueden grabarse en un vdeo o pelcula. Las imgenes de diagnstico por ultrasonido generalmente se denominan sonografa o ultrasonografa.  Hay diferentes tipos de ecografa. Una ecografa de la vescula, el hgado y el pncreas puede mostrar clculos, bultos, quistes, inflamacin, infecciones o agrandamiento de los rganos. La ecografa de los riones puede mostrar quistes, bultos, clculos renales y la forma y el tamao del rin. Una ecografa de la pelvis Crown Holdingsmuestra el tero, los ovarios y los quistes o bultos que pudiera haber. El examen obsttrico muestra la posicin del feto, puede medirse la madurez, la frecuencia cardaca fetal y los rganos fetales. Una ecografa de la mama, de la tiroides, o los testculos muestra si un ndulo es slido o qustico.  RIESGOS Y COMPLICACIONES Esta tcnica se ha utilizado durante muchos aos y nunca ha mostrado tener efectos dainos. Algunos estudios en seres humanos no han revelado que exista relacin directa entre el uso de la ecografa diagnstica y Kenyaalguna consecuencia adversa.  ANTES DEL PROCEDIMIENTO  No debe comer ni beber nada, slo agua, durante las 8 a 12 horas previas a la Azerbaijanciruga, o segn lo que le indique el mdico.. Siga las indicaciones del mdico con respecto a otras indicaciones de la dieta. Si van a tomarle una ecogfa de la pelvis, deber beber gran cantidad de lquido antes del examen. La vejiga llena ayuda a ver los rganos que se encuentran detrs de ella.  PROCEDIMIENTO  No hay dolor en los exmenes realizados con ecografas. Se aplicar un gel sobre la piel y un dispositivo denominado transductor se coloca sobre el rea a Ecologistexaminar. El gel  podr sentirse algo fro. Aunque el gel se limpia fcilmente, es una buena idea utilizar ropa fcilmente lavable.  Las imgenes se muestran en uno o ms monitores que se asemejan a pequeas pantallas de televisin. Las ondas sonoras producen imgenes de los rganos que el transductor encuentra en su camino. Durante el examen, generalmente la habitacin se oscurece. Esto facilita la observacin de las imgenes en el monitor. La ecografa demora menos de 1 hora.  DESPUS DEL PROCEDIMIENTO  Podr conducir su automvil y regresar a sus actividades habituales inmediatamente despus del examen. Consulte con su mdico la fecha en que los resultados estarn disponibles. Asegrese de Starbucks Corporationobtener los resultados.  Document Released: 08/22/2005 Document Revised: 02/04/2012 [facility] Patient Information 2015 [place], [place]. This information is not intended to replace advice given to you by your health care provider. Make sure you discuss any questions you have with your health care provider. Colelitiasis (Cholelithiasis) La colelitiasis (tambin llamada clculos en la vescula) es una enfermedad de la vescula. La vescula es un rgano pequeo que ayuda a digerir las McChord AFBgrasas. Los sntomas de clculos en la vescula son:  Caleen EssexGanas de vomitar (nuseas).  Devolver la comida (vomitar).  Dolor en el vientre.  Piel amarilla (ictericia)  Dolor sbito. Podr sentir Chief Technology Officerel dolor durante algunos minutos o unas horas.  Grant RutsFiebre.  Dolor a Insurance claims handlerla palpacin. CUIDADOS EN EL HOGAR  Tome slo los medicamentos segn le haya indicado el mdico.  Siga una dieta baja en grasas hasta que vuelva a ver al mdico nuevamente. Consumir grasas puede Programmer, multimediacausar dolor.  Concurra a las consultas de control con el mdico, segn las  indicaciones. Los ataques generalmente ocurren repetidas veces. Generalmente se requiere de [employer]. SOLICITE AYUDA DE INMEDIATO SI:   El dolor empeora.  El dolor no se alivia con los  [employer] Parcel.  Tiene fiebre o sntomas que persisten durante ms de 2-3 das.  Tiene fiebre y los sntomas empeoran repentinamente.  Siente nuseas y vomita. ASEGRESE DE QUE: Omeprazole tablets (OTC) Qu es este medicamento? El OMEPRAZOL previene la produccin de cido en el estmago. Ayuda a tratar los sntomas de la acidez estomacal. [place] medicamento se puede adquirir sin receta. Este producto no debe usarse por perodos prolongados, a menos que su mdico o su profesional de Radiographer, therapeutic. Este medicamento puede ser utilizado para otros usos; si tiene alguna pregunta consulte con su proveedor de atencin mdica o con su farmacutico. MARCAS COMERCIALES DISPONIBLES: Prilosec OTC Qu le debo informar a mi profesional de la salud antes de tomar este medicamento? Necesita saber si usted presenta alguno de los siguientes problemas o situaciones: -heces de color oscuro o con sangre -dolor en el pecho -dificultad para tragar -si ha tenido Merchant navy officer durante ms de 3 meses -si tiene Merchant navy officer con [employer], aturdimiento o sudoracin -enfermedad heptica -dolor estomacal -prdida de peso que no tiene explicacin -vmito con Retail buyer -sibilancias -una reaccin alrgica o inusual al omeprazol, a otros medicamentos, alimentos, colorantes o conservantes -si est embarazada o buscando quedar embarazada -si est amamantando a un beb Cmo debo utilizar este medicamento? Tome este medicamento por va oral. Siga las instrucciones de la etiqueta del [place]. Si est tomando este medicamento sin receta, toma una tableta cada da. No usar durante ms de [address] o repetir un curso de tratamiento ms a menudo que cada 4 meses a menos que as lo indique su mdico o profesional de Radiographer, therapeutic. Tome su dosis a intervalos regulares cada 24 horas. Trague la tableta entera con un vaso de agua. No triture, rompe ni mstique este medicamento. Este medicamento acta mejor si se lo toma con el estmago vaco  30 minutos antes del desayuno. Si esta usando este medicamento con una receta proporcionada por su mdico o profesional de la salud, siga las instrucciones que recibi. No tome su medicamento con una frecuencia mayor a la indicada. Hable con su pediatra para informarse acerca del uso de este medicamento en nios. Puede requerir atencin especial. Sobredosis: Pngase en contacto inmediatamente con un centro toxicolgico o una sala de urgencia si usted cree que haya tomado demasiado medicamento. ATENCIN: [employer] es solo para usted. No comparta este medicamento con nadie. Qu sucede si me olvido de una dosis? Si olvida una dosis, tmela lo antes posible. Si es casi la hora de la prxima dosis, tome slo esa dosis. No tome dosis adicionales o dobles. Qu puede interactuar con este medicamento? No tome esta medicina con ninguno de los siguientes medicamentos: -atazanavir -clopidogrel -nelfinavir Esta medicina tambin puede interactuar con los siguientes medicamentos: -ampicilina -ciertos medicamentos para la ansiedad o para conciliar el sueo -ciertos medicamentos que tratan o previenen cogulos sanguneos, como warfarina -ciclosporina -diazepam -digoxina -disulfiram -sales de hierro -fenitona -medicamentos recetados para infecciones micticas o por levadura, tales como itraconazol, quetoconazol, voriconazol -saquinavir -tacrolimo Puede ser que esta lista no menciona todas las posibles interacciones. Informe a su profesional de [employer] Homes de [employer] Inc productos a base de hierbas, medicamentos de [place] o suplementos nutritivos que est tomando. Si usted fuma, consume bebidas alcohlicas o si utiliza drogas ilegales, indqueselo tambin a su profesional de la  salud. Algunas sustancias pueden interactuar con su medicamento. A qu debo estar atento al usar PPL Corporation? Puede necesitar varios das de tratamiento para que mejore su acidez estomacal. Consulte a su mdico o a su  profesional de la salud si su afeccin no comienza a Scientist, clinical (histocompatibility and immunogenetics) o si empeora. No trate la diarrea con productos de [employer]. [employer]inz. Comunquese con su mdico si tiene diarrea que dura ms de 2 das o si es severa y [place]. No se trate usted mismo para la acidez estomacal con este medicamento durante ms de 14 das seguidos. Slo debe automedicarse con este medicamento durante perodos de tratamiento de 2 semanas cada 4 meses. Si sus sntomas vuelven a aparecer poco despus de haber finalizado el tratamiento o dentro de los prximos 4 meses, consulte a su mdico o su profesional de Radiographer, therapeutic. Qu efectos secundarios puedo tener al Boston Scientific este medicamento? Efectos secundarios que debe informar a su mdico o a Producer, television/film/video de la salud tan pronto como sea posible: -Therapist, art como erupcin cutnea, picazn o urticarias, hinchazn de la cara, labios o lengua -dolor de hueso, msculo o articulaciones -problemas respiratorios -dolor u opresin en el pecho -orina de color amarillo oscuro o marrn -diarrea -mareos -pulso cardiaco rpido, irregular -sensacin de desmayos o aturdimiento -fiebre o dolor de garganta -espasmos musculares -palpitaciones -enrojecimiento, formacin de ampollas, descamacin o distensin de la piel, inclusive dentro de la boca -convulsiones -temblores -sangrado o magulladuras inusuales -cansancio o debilidad inusual -color amarillento de los ojos o la piel Efectos secundarios que, por lo general, no requieren atencin mdica (debe informarlos a su mdico o a su profesional de la salud si persisten o si son molestos): -estreimiento -boca seca -dolor de cabeza -heces sueltas -nauseas Puede ser que esta lista no menciona todos los posibles efectos secundarios. Comunquese a su mdico por asesoramiento mdico Hewlett-[employer]ard. Usted puede informar los efectos secundarios a la FDA por telfono al 1-800-FDA-1088. Dnde debo guardar mi medicina? Mantngala  fuera del alcance de los nios. Gurdela a Sanmina-SCI, entre 20 y 25 grados C (67 y 87 grados F). Protjala de la luz y de la humedad. Deseche los medicamentos que no haya utilizado, despus de la fecha de vencimiento. ATENCIN: Este folleto es un resumen. Puede ser que no cubra toda la posible informacin. Si usted tiene preguntas acerca de esta medicina, consulte con su mdico, su farmacutico o su profesional de Radiographer, therapeutic.  2015, Elsevier/Gold Standard. (2011-09-24 18:20:10) Reflujo gastroesofgico - Adultos  (Gastroesophageal Reflux Disease, Adult)  El reflujo gastroesofgico ocurre cuando el cido del estmago pasa al esfago. Cuando el cido entra en contacto con el esfago, el cido provoca dolor (inflamacin) en el esfago. Con el tiempo, pueden formarse pequeos agujeros (lceras) en el revestimiento del esfago. CAUSAS   Exceso de Runner, broadcasting/film/video. Esto aplica presin Eli Lilly and Company, lo que hace que el cido del estmago suba hacia el esfago.  El hbito de fumar Aumenta la produccin de cido en el [place].  El consumo de alcohol. Provoca disminucin de la presin en el esfnter esofgico inferior (vlvula o anillo de msculo entre el esfago y Investment banker, corporate), permitiendo que el cido del estmago suba hacia el esfago.  Cenas a ltima hora del da y estmago lleno. Aumenta la presin y la produccin de cido en el estmago.  Malformacin en el esfnter esofgico inferior. A menudo no se halla causa.  SNTOMAS   Ardor y Radiographer, therapeutic parte inferior del pecho detrs del esternn y  en la zona media del estmago. Puede ocurrir Toys 'R[employer] Us por semana o ms a menudo.  Dificultad para tragar.  Dolor de Advertising copywriter.  Tos seca.  Sntomas similares al asma que incluyen sensacin de opresin en el pecho, falta de aire y sibilancias. DIAGNSTICO  El mdico diagnosticar el problema basndose en los sntomas. En algunos casos, se indican radiografas y otras pruebas para verificar si  hay complicaciones o para comprobar el estado del [address] y Training and development officer.  TRATAMIENTO  El mdico le indicar medicamentos de venta libre o recetados para ayudar a disminuir la produccin de cido. Consulte con su mdico antes de Corporate investment banker o agregar cualquier medicamento nuevo.  INSTRUCCIONES PARA EL CUIDADO EN EL HOGAR   Modifique los factores que pueda cambiar. Consulte con su mdico para solicitar orientacin relacionada con la prdida de peso, dejar de fumar y el consumo de alcohol.  Evite las comidas y bebidas que [address] [place], [place]:  [place] con cafena o alcohlicas.  Chocolate.  Sabores a Advertising account planner.  Ajo y cebolla.  Comidas muy condimentadas.  Ctricos como naranjas, limones o limas.  Alimentos que contengan tomate, como salsas, [place] y pizza.  Alimentos fritos y Lexicographer.  Evite acostarse durante 3 horas antes de irse a dormir o antes de tomar una siesta.  Haga comidas pequeas durante Glass blower/designer de 3 comidas abundantes.  Use ropas sueltas. No use nada apretado alrededor de la cintura que cause presin en el estmago.  Levante (eleve) la cabecera de la cama 6 a 8 pulgadas (15 a 20 cm) con bloques de madera. Usar almohadas extra no ayuda.  Solo tome medicamentos que se pueden comprar sin receta o recetados para el dolor, Dentist o fiebre, como le indica el mdico.  No tome aspirina, ibuprofeno ni antiinflamatorios no esteroides. SOLICITE ATENCIN MDICA DE Engelhard Corporation SI:   Goldman Sachs, el cuello, la [place], los dientes o la espalda.  El dolor aumenta o cambia la intensidad o la durancin.  Tiene nuseas, vmitos o sudoracin(diaforesis).  Siente falta de aire o dolor en el pecho, o se desmaya.  Vomita y el vmito tiene [place], es de color [place], [place], negro o es similar a la borra del caf o tiene [place].  Las heces son rojas, sanguinolentas o negras. Estos sntomas pueden ser signos de [address], como enfermedades cardacas,  hemorragias gstrias o sangrado esofgico.  ASEGRESE DE QUE:   Comprende estas instrucciones.  Controlar su enfermedad.  Solicitar ayuda de inmediato si no mejora o si empeora. Document Released: 08/22/2005 Document Revised: 02/04/2012 [facility] Patient Information 2015 [place], [place]. This information is not intended to replace advice given to you by your health care provider. Make sure you discuss any questions you have with your health care provider.   Comprende estas instrucciones.  Controlar su afeccin.  Recibir ayuda de inmediato si no mejora o si empeora. Document Released: 02/08/2009 Document Revised: 07/15/2013 [facility] Patient Information 2015 [place], [place]. This information is not intended to replace advice given to you by your health care provider. Make sure you discuss any questions you have with your health care provider. Opciones de alimentos para pacientes con reflujo gastroesofgico (Food Choices for Gastroesophageal Reflux Disease) Cuando se tiene reflujo gastroesofgico (ERGE), los alimentos que se ingieren y los hbitos de alimentacin son muy importantes. Elegir los alimentos adecuados puede ayudar a Paramedic las molestias ocasionadas por el [place]. QU PAUTAS GENERALES DEBO SEGUIR?  Elija las frutas, los vegetales, los cereales integrales, los productos [place],  la carne de vaca, de pescado y de ave con bajo contenido de grasas.  Limite las grasas, [address] [place], los aderezos para [place], la [place], los frutos secos y Programme researcher, broadcasting/film/video.  Lleve un registro de las comidas para identificar los alimentos que ocasionan sntomas.  Evite los alimentos que le ocasionen reflujo. Pueden ser distintos para cada persona.  Haga comidas pequeas con frecuencia en lugar de tres comidas OfficeMax Incorporated.  Coma lentamente, en un clima distendido.  Limite el consumo de alimentos fritos.  Cocine los alimentos utilizando mtodos que no sean la fritura.  Evite  el consumo alcohol.  Evite beber grandes cantidades de lquidos con las comidas.  Evite agacharse o recostarse hasta despus de 2 o 3horas de haber comido. QU ALIMENTOS NO SE RECOMIENDAN? Los siguientes son algunos alimentos y bebidas que pueden empeorar los sntomas: Veterinary surgeon. Jugo de tomate. Salsa de tomate y espagueti. Ajes. Cebolla y [place]. Rbano picante. Frutas Naranjas, pomelos y limn (fruta y [place]). Carnes Carnes de [place], de pescado y de ave con gran contenido de grasas. Esto incluye los perros calientes, las [place], el [place], la salchicha, el salame y el tocino. Lcteos Leche entera y [place]. PPG Industries. Crema. [place]. Helados. Queso crema.  Bebidas Caf y t negro, con o sin cafena Bebidas gaseosas o energizantes. Condimentos Salsa picante. Salsa barbacoa.  Dulces/postres Chocolate y cacao. [place]. Menta y mentol. Grasas y [employer] Mutual Life con alto contenido de grasas, incluidas las papas fritas. Otros [place]. Especias picantes, como la Brink[employer] Company, la pimienta blanca, la pimienta roja, la pimienta de cayena, el curry en [place], los clavos de [place], el jengibre y el [place] en polvo. Los artculos mencionados arriba pueden no ser Raytheon de las bebidas y los alimentos que se Theatre stage manager. Comunquese con el nutricionista para recibir ms informacin. Document Released: 08/22/2005 Document Revised: 11/17/2013 [facility] Patient Information 2015 [place], [place]. This information is not intended to replace advice given to you by your health care provider. Make sure you discuss any questions you have with your health care provider.

## 2014-07-02 NOTE — Progress Notes (Signed)
Delorice Bannister 24-Feb-1966 [id number]   History:    48 y.o.  for annual gyn exam with complaint of on-and-off right upper quadrant discomfort as well as occasional belching for several months now. Patient also complaining of nonspecific rash and pruritus of both arms. Patient with past history of abdominal hysterectomy in the past. Patient with no previous abnormal Pap smears.  Past medical history,surgical history, family history and social history were all reviewed and documented in the EPIC chart.  Gynecologic History No LMP recorded. Patient has had a hysterectomy. Contraception: status post hysterectomy Last Pap: 2012. Results were: normal Last mammogram: 2014. Results were: normal  Obstetric History OB History  Gravida Para Term Preterm AB SAB TAB Ectopic Multiple Living  4 2 2  2     2     # Outcome Date GA Lbr Len/2nd Weight Sex Delivery Anes PTL Lv  4 ABT           3 ABT           2 TRM     F SVD  N Y  1 TRM     M SVD  N Y       ROS: A ROS was performed and pertinent positives and negatives are included in the history.  GENERAL: No fevers or chills. HEENT: No change in vision, no earache, sore throat or sinus congestion. NECK: No pain or stiffness. CARDIOVASCULAR: No chest pain or pressure. No palpitations. PULMONARY: No shortness of breath, cough or wheeze. GASTROINTESTINAL: Complaining of right upper quadrant discomfort, no diarrhea no nausea no blood in stools GENITOURINARY: No urinary frequency, urgency, hesitancy or dysuria. MUSCULOSKELETAL: No joint or muscle pain, no back pain, no recent trauma. DERMATOLOGIC: No rash, no itching, no lesions. ENDOCRINE: No polyuria, polydipsia, no heat or cold intolerance. No recent change in weight. HEMATOLOGICAL: No anemia or easy bruising or bleeding. NEUROLOGIC: No headache, seizures, numbness, tingling or weakness. PSYCHIATRIC: No depression, no loss of interest in normal activity or change in sleep pattern.      Exam: chaperone present  BP 122/80  Ht 5' (1.524 m)  Wt 174 lb (78.926 kg)  BMI 33.98 kg/m2  Body mass index is 33.98 kg/(m^2).  General appearance : Well developed well nourished female. No acute distress HEENT: Neck supple, trachea midline, no carotid bruits, no thyroidmegaly Lungs: Clear to auscultation, no rhonchi or wheezes, or rib retractions  Heart: Regular rate and rhythm, no murmurs or gallops Breast:Examined in sitting and supine position were symmetrical in appearance, no palpable masses or tenderness,  no skin retraction, no nipple inversion, no nipple discharge, no skin discoloration, no axillary or supraclavicular lymphadenopathy Abdomen: no palpable masses or tenderness, no rebound or guarding Extremities: Small areas with papules in the right and left arm Pelvic:  Bartholin, Urethra, Skene Glands: Within normal limits             Vagina: No gross lesions or discharge  Cervix: Absent  Uterus  Absent  Adnexa  Without masses or tenderness  Anus and perineum  normal   Rectovaginal  normal sphincter tone without palpated masses or tenderness             Hemoccult will provide   Assessment/Plan:  48 y.o. female for annual exam with right upper quadrant discomfort on and off not able to pin down any particular type of food. Also was some reflux. If the patient has gastroesophageal reflux disease or may have underlying cholelithiasis. An ultrasound will be ordered  of the upper abdomen to rule out cholelithiasis. The nausea will be placed on Prilosec 20 mg daily. If ultrasound is normal and patient continues with symptoms will refer to gastroenterologist. Pap smear was done today. Patient will return back next week and a fasting state for a fasting lipid profile, comprehensive metabolic panel, TSH, CBC and urinalysis. Patient was reminded of the importance of monthly breast exam. We discussed importance of calcium vitamin D for osteoporosis prevention. For nonspecific pruritus  on her right and left arm she may be contacting nature she'll be prescribed Mtytrex cream to apply twice a day to 3 times a day for 7-10 days.   Note: This dictation was prepared with  Dragon/digital dictation along withSmart phrase technology. Any transcriptional errors that result from this process are unintentional.   Ok EdwardsFERNANDEZ,Margaurite Salido H MD, 7:32 AM 07/02/2014

## 2014-07-05 ENCOUNTER — Telehealth: Payer: Self-pay | Admitting: *Deleted

## 2014-07-05 ENCOUNTER — Ambulatory Visit: Payer: 59

## 2014-07-05 DIAGNOSIS — Z01419 Encounter for gynecological examination (general) (routine) without abnormal findings: Secondary | ICD-10-CM

## 2014-07-05 DIAGNOSIS — G8929 Other chronic pain: Secondary | ICD-10-CM

## 2014-07-05 DIAGNOSIS — R1011 Right upper quadrant pain: Principal | ICD-10-CM

## 2014-07-05 LAB — CBC WITH DIFFERENTIAL/PLATELET
Basophils Absolute: 0 10*3/uL (ref 0.0–0.1)
Basophils Relative: 1 % (ref 0–1)
EOS ABS: 0.4 10*3/uL (ref 0.0–0.7)
Eosinophils Relative: 10 % — ABNORMAL HIGH (ref 0–5)
HCT: 41.1 % (ref 36.0–46.0)
Hemoglobin: 13.7 g/dL (ref 12.0–15.0)
LYMPHS ABS: 1.9 10*3/uL (ref 0.7–4.0)
Lymphocytes Relative: 44 % (ref 12–46)
MCH: 30.5 pg (ref 26.0–34.0)
MCHC: 33.3 g/dL (ref 30.0–36.0)
MCV: 91.5 fL (ref 78.0–100.0)
MONOS PCT: 7 % (ref 3–12)
Monocytes Absolute: 0.3 10*3/uL (ref 0.1–1.0)
NEUTROS ABS: 1.6 10*3/uL — AB (ref 1.7–7.7)
NEUTROS PCT: 38 % — AB (ref 43–77)
PLATELETS: 274 10*3/uL (ref 150–400)
RBC: 4.49 MIL/uL (ref 3.87–5.11)
RDW: 14.8 % (ref 11.5–15.5)
WBC: 4.3 10*3/uL (ref 4.0–10.5)

## 2014-07-05 NOTE — Telephone Encounter (Signed)
Message copied by Aura CampsWEBB, JENNIFER L on Mon Jul 05, 2014 10:37 AM ------      Message from: Ok EdwardsFERNANDEZ, JUAN H      Created: Thu Jul 01, 2014  4:06 PM       Pleasure schedule upper abdominal ultrasound on this patient with RUQ pains. R/O cholelithiasis.See if an am appointmentment can be made. She is going to take a morning off work. She will need to come to office in am fasting for lab work. I have put labs in computer. Please coordinate. Thanks ------

## 2014-07-05 NOTE — Telephone Encounter (Signed)
[facility] non[facility] by mouth after midnight. Debarah CrapeClaudia will inform patient.

## 2014-07-06 ENCOUNTER — Other Ambulatory Visit: Payer: Self-pay | Admitting: Gynecology

## 2014-07-06 DIAGNOSIS — R799 Abnormal finding of blood chemistry, unspecified: Secondary | ICD-10-CM

## 2014-07-06 DIAGNOSIS — D729 Disorder of white blood cells, unspecified: Secondary | ICD-10-CM

## 2014-07-06 LAB — URINALYSIS W MICROSCOPIC + REFLEX CULTURE
Bilirubin Urine: NEGATIVE
Casts: NONE SEEN
Crystals: NONE SEEN
Glucose, UA: NEGATIVE mg/dL
Hgb urine dipstick: NEGATIVE
Ketones, ur: NEGATIVE mg/dL
Leukocytes, UA: NEGATIVE
NITRITE: NEGATIVE
PROTEIN: NEGATIVE mg/dL
Specific Gravity, Urine: 1.02 (ref 1.005–1.030)
Urobilinogen, UA: 0.2 mg/dL (ref 0.0–1.0)
pH: 8 (ref 5.0–8.0)

## 2014-07-06 LAB — LIPID PANEL
Cholesterol: 167 mg/dL (ref 0–200)
HDL: 47 mg/dL (ref >39)
LDL CALC: 101 mg/dL — AB (ref 0–99)
Total CHOL/HDL Ratio: 3.6 Ratio
Triglycerides: 93 mg/dL (ref <150)
VLDL: 19 mg/dL (ref 0–40)

## 2014-07-06 LAB — TSH: TSH: 3.077 u[IU]/mL (ref 0.350–4.500)

## 2014-07-06 LAB — COMPREHENSIVE METABOLIC PANEL
ALBUMIN: 4.1 g/dL (ref 3.5–5.2)
ALK PHOS: 53 U/L (ref 39–117)
ALT: 18 U/L (ref 0–35)
AST: 17 U/L (ref 0–37)
BILIRUBIN TOTAL: 0.3 mg/dL (ref 0.2–1.2)
BUN: 16 mg/dL (ref 6–23)
CO2: 25 mEq/L (ref 19–32)
Calcium: 9.1 mg/dL (ref 8.4–10.5)
Chloride: 107 mEq/L (ref 96–112)
Creat: 0.71 mg/dL (ref 0.50–1.10)
GLUCOSE: 102 mg/dL — AB (ref 70–99)
POTASSIUM: 4.9 meq/L (ref 3.5–5.3)
SODIUM: 140 meq/L (ref 135–145)
TOTAL PROTEIN: 6.6 g/dL (ref 6.0–8.3)

## 2014-07-07 ENCOUNTER — Other Ambulatory Visit: Payer: Self-pay | Admitting: Gynecology

## 2014-07-07 ENCOUNTER — Ambulatory Visit (HOSPITAL_COMMUNITY)
Admission: RE | Admit: 2014-07-07 | Discharge: 2014-07-07 | Disposition: A | Discharge status: Home / Self Care | Payer: 59 | Source: Ambulatory Visit | Attending: Gynecology | Admitting: Gynecology

## 2014-07-07 DIAGNOSIS — R109 Unspecified abdominal pain: Secondary | ICD-10-CM | POA: Diagnosis present

## 2014-07-07 DIAGNOSIS — G8929 Other chronic pain: Secondary | ICD-10-CM

## 2014-07-07 DIAGNOSIS — R1011 Right upper quadrant pain: Secondary | ICD-10-CM

## 2014-07-07 MED ORDER — CIPROFLOXACIN HCL 250 MG PO TABS: 250.0000 mg | ORAL_TABLET | Freq: Two times a day (BID) | ORAL | Status: DC

## 2014-07-08 LAB — URINE CULTURE

## 2014-07-12 ENCOUNTER — Other Ambulatory Visit: Payer: Self-pay

## 2014-07-12 DIAGNOSIS — Z1231 Encounter for screening mammogram for malignant neoplasm of breast: Secondary | ICD-10-CM

## 2014-07-16 ENCOUNTER — Other Ambulatory Visit: Payer: 59

## 2014-07-16 DIAGNOSIS — D729 Disorder of white blood cells, unspecified: Secondary | ICD-10-CM

## 2014-07-16 DIAGNOSIS — R799 Abnormal finding of blood chemistry, unspecified: Secondary | ICD-10-CM

## 2014-07-16 LAB — CBC WITH DIFFERENTIAL/PLATELET
BASOS PCT: 0 % (ref 0–1)
Basophils Absolute: 0 10*3/uL (ref 0.0–0.1)
EOS ABS: 0.4 10*3/uL (ref 0.0–0.7)
Eosinophils Relative: 7 % — ABNORMAL HIGH (ref 0–5)
HEMATOCRIT: 36.9 % (ref 36.0–46.0)
Hemoglobin: 12.2 g/dL (ref 12.0–15.0)
Lymphocytes Relative: 32 % (ref 12–46)
Lymphs Abs: 1.8 10*3/uL (ref 0.7–4.0)
MCH: 30.3 pg (ref 26.0–34.0)
MCHC: 33.1 g/dL (ref 30.0–36.0)
MCV: 91.6 fL (ref 78.0–100.0)
MONO ABS: 0.3 10*3/uL (ref 0.1–1.0)
MONOS PCT: 6 % (ref 3–12)
NEUTROS ABS: 3.1 10*3/uL (ref 1.7–7.7)
Neutrophils Relative %: 55 % (ref 43–77)
Platelets: 269 10*3/uL (ref 150–400)
RBC: 4.03 MIL/uL (ref 3.87–5.11)
RDW: 14.4 % (ref 11.5–15.5)
WBC: 5.6 10*3/uL (ref 4.0–10.5)

## 2014-09-27 ENCOUNTER — Encounter: Payer: Self-pay | Admitting: Gynecology

## 2014-10-01 ENCOUNTER — Ambulatory Visit
Admission: RE | Admit: 2014-10-01 | Discharge: 2014-10-01 | Disposition: A | Discharge status: Home / Self Care | Payer: 59 | Source: Ambulatory Visit

## 2014-10-01 DIAGNOSIS — Z1231 Encounter for screening mammogram for malignant neoplasm of breast: Secondary | ICD-10-CM

## 2014-10-04 ENCOUNTER — Ambulatory Visit: Payer: 59

## 2015-07-06 ENCOUNTER — Ambulatory Visit (INDEPENDENT_AMBULATORY_CARE_PROVIDER_SITE_OTHER): Payer: 59 | Admitting: Gynecology

## 2015-07-06 ENCOUNTER — Encounter: Payer: Self-pay | Admitting: Gynecology

## 2015-07-06 VITALS — BP 124/80 | Ht 60.25 in | Wt 179.0 lb

## 2015-07-06 DIAGNOSIS — Z01419 Encounter for gynecological examination (general) (routine) without abnormal findings: Secondary | ICD-10-CM

## 2015-07-06 LAB — CBC WITH DIFFERENTIAL/PLATELET
BASOS PCT: 1 % (ref 0–1)
Basophils Absolute: 0.1 10*3/uL (ref 0.0–0.1)
EOS ABS: 0.3 10*3/uL (ref 0.0–0.7)
Eosinophils Relative: 6 % — ABNORMAL HIGH (ref 0–5)
HEMATOCRIT: 39.9 % (ref 36.0–46.0)
HEMOGLOBIN: 13.6 g/dL (ref 12.0–15.0)
LYMPHS ABS: 2.1 10*3/uL (ref 0.7–4.0)
Lymphocytes Relative: 37 % (ref 12–46)
MCH: 30.9 pg (ref 26.0–34.0)
MCHC: 34.1 g/dL (ref 30.0–36.0)
MCV: 90.7 fL (ref 78.0–100.0)
MPV: 10.1 fL (ref 8.6–12.4)
Monocytes Absolute: 0.5 10*3/uL (ref 0.1–1.0)
Monocytes Relative: 8 % (ref 3–12)
NEUTROS ABS: 2.7 10*3/uL (ref 1.7–7.7)
NEUTROS PCT: 48 % (ref 43–77)
Platelets: 278 10*3/uL (ref 150–400)
RBC: 4.4 MIL/uL (ref 3.87–5.11)
RDW: 14.9 % (ref 11.5–15.5)
WBC: 5.7 10*3/uL (ref 4.0–10.5)

## 2015-07-06 LAB — LIPID PANEL
CHOL/HDL RATIO: 3.7 ratio (ref <=5.0)
CHOLESTEROL: 157 mg/dL (ref 125–200)
HDL: 42 mg/dL — AB (ref >=46)
LDL Cholesterol: 95 mg/dL (ref <130)
TRIGLYCERIDES: 101 mg/dL (ref <150)
VLDL: 20 mg/dL (ref <30)

## 2015-07-06 LAB — COMPREHENSIVE METABOLIC PANEL
ALBUMIN: 3.8 g/dL (ref 3.6–5.1)
ALT: 23 U/L (ref 6–29)
AST: 22 U/L (ref 10–35)
Alkaline Phosphatase: 48 U/L (ref 33–115)
BUN: 18 mg/dL (ref 7–25)
CHLORIDE: 103 mmol/L (ref 98–110)
CO2: 26 mmol/L (ref 20–31)
Calcium: 9.4 mg/dL (ref 8.6–10.2)
Creat: 0.68 mg/dL (ref 0.50–1.10)
Glucose, Bld: 98 mg/dL (ref 65–99)
POTASSIUM: 5.1 mmol/L (ref 3.5–5.3)
Sodium: 138 mmol/L (ref 135–146)
Total Bilirubin: 0.5 mg/dL (ref 0.2–1.2)
Total Protein: 6.5 g/dL (ref 6.1–8.1)

## 2015-07-06 NOTE — Progress Notes (Signed)
Elizabeth Hernandez 1965-12-24 [id number]   History:    49 y.o.  for annual gyn exam with no complaints today.Patient with past history of abdominal hysterectomy in the past. Patient with no previous abnormal Pap smears  Past medical history,surgical history, family history and social history were all reviewed and documented in the EPIC chart.  Gynecologic History No LMP recorded. Patient has had a hysterectomy. Contraception: status post hysterectomy Last Pap: 2012. Results were: normal Last mammogram: 2015. Results were: Normal but dense had three-dimensional mammogram  Obstetric History OB History  Gravida Para Term Preterm AB SAB TAB Ectopic Multiple Living  # Outcome Date GA Lbr Len/2nd Weight Sex Delivery Anes PTL Lv  4 AB           3 AB           2 Term     F Vag-Spont  N Y  1 Term     M Vag-Spont  N Y       ROS: A ROS was performed and pertinent positives and negatives are included in the history.  GENERAL: No fevers or chills. HEENT: No change in vision, no earache, sore throat or sinus congestion. NECK: No pain or stiffness. CARDIOVASCULAR: No chest pain or pressure. No palpitations. PULMONARY: No shortness of breath, cough or wheeze. GASTROINTESTINAL: No abdominal pain, nausea, vomiting or diarrhea, melena or bright red blood per rectum. GENITOURINARY: No urinary frequency, urgency, hesitancy or dysuria. MUSCULOSKELETAL: No joint or muscle pain, no back pain, no recent trauma. DERMATOLOGIC: No rash, no itching, no lesions. ENDOCRINE: No polyuria, polydipsia, no heat or cold intolerance. No recent change in weight. HEMATOLOGICAL: No anemia or easy bruising or bleeding. NEUROLOGIC: No headache, seizures, numbness, tingling or weakness. PSYCHIATRIC: No depression, no loss of interest in normal activity or change in sleep pattern.     Exam: chaperone present  BP 124/80 mmHg  Ht 5' 0.25" (1.53 m)  Wt 179 lb (81.194 kg)  BMI 34.68 kg/m2  Body  mass index is 34.68 kg/(m^2).  General appearance : Well developed well nourished female. No acute distress HEENT: Eyes: no retinal hemorrhage or exudates,  Neck supple, trachea midline, no carotid bruits, no thyroidmegaly Lungs: Clear to auscultation, no rhonchi or wheezes, or rib retractions  Heart: Regular rate and rhythm, no murmurs or gallops Breast:Examined in sitting and supine position were symmetrical in appearance, no palpable masses or tenderness,  no skin retraction, no nipple inversion, no nipple discharge, no skin discoloration, no axillary or supraclavicular lymphadenopathy Abdomen: no palpable masses or tenderness, no rebound or guarding Extremities: no edema or skin discoloration or tenderness  Pelvic:  Bartholin, Urethra, Skene Glands: Within normal limits             Vagina: No gross lesions or discharge  Cervix: Absent  Uterus  absent  Adnexa  Without masses or tenderness  Anus and perineum  normal   Rectovaginal  normal sphincter tone without palpated masses or tenderness             Hemoccult not indicated     Assessment/Plan:  49 y.o. female for annual exam and needs to work on exercise to lose some weight. She gained 5 pounds from last year. She weighs 179 pounds with a BMI 34.67 kg/m. We discussed importance of healthy nutrition. The following screening blood work was ordered today: Comprehensive metabolic panel, fasting lipid profile, TSH, CBC, and urinalysis.  Pap smear not indicated. Mammogram at the end of this year patient to request three-dimensional mammogram as a result of her dense breasts.   Ok Edwards MD, 10:10 AM 07/06/2015

## 2015-07-07 ENCOUNTER — Other Ambulatory Visit: Payer: Self-pay | Admitting: Gynecology

## 2015-07-07 DIAGNOSIS — R3129 Other microscopic hematuria: Secondary | ICD-10-CM

## 2015-07-07 LAB — URINALYSIS W MICROSCOPIC + REFLEX CULTURE
BILIRUBIN URINE: NEGATIVE
Bacteria, UA: NONE SEEN [HPF]
CASTS: NONE SEEN [LPF]
Crystals: NONE SEEN [HPF]
Glucose, UA: NEGATIVE
Hgb urine dipstick: NEGATIVE
Ketones, ur: NEGATIVE
Leukocytes, UA: NEGATIVE
Nitrite: NEGATIVE
PROTEIN: NEGATIVE
Specific Gravity, Urine: 1.023 (ref 1.001–1.035)
WBC UA: NONE SEEN WBC/HPF (ref <=5)
Yeast: NONE SEEN [HPF]
pH: 8 (ref 5.0–8.0)

## 2015-07-07 LAB — TSH: TSH: 2.945 u[IU]/mL (ref 0.350–4.500)

## 2015-07-08 LAB — URINE CULTURE
COLONY COUNT: NO GROWTH
Organism ID, Bacteria: NO GROWTH

## 2015-07-14 ENCOUNTER — Other Ambulatory Visit: Payer: 59

## 2015-07-14 DIAGNOSIS — R3129 Other microscopic hematuria: Secondary | ICD-10-CM

## 2015-07-15 LAB — URINALYSIS W MICROSCOPIC + REFLEX CULTURE
Bacteria, UA: NONE SEEN [HPF]
Bilirubin Urine: NEGATIVE
CASTS: NONE SEEN [LPF]
Crystals: NONE SEEN [HPF]
Glucose, UA: NEGATIVE
KETONES UR: NEGATIVE
Leukocytes, UA: NEGATIVE
Nitrite: NEGATIVE
Protein, ur: NEGATIVE
SPECIFIC GRAVITY, URINE: 1.021 (ref 1.001–1.035)
WBC, UA: NONE SEEN WBC/HPF (ref <=5)
YEAST: NONE SEEN [HPF]
pH: 7 (ref 5.0–8.0)

## 2015-07-16 LAB — URINE CULTURE: Colony Count: 3000

## 2015-07-18 ENCOUNTER — Other Ambulatory Visit: Payer: Self-pay | Admitting: Gynecology

## 2015-07-18 DIAGNOSIS — R3129 Other microscopic hematuria: Secondary | ICD-10-CM

## 2015-08-05 ENCOUNTER — Telehealth: Payer: Self-pay

## 2015-08-05 NOTE — Telephone Encounter (Signed)
Lets  go ahead and refer her to [facility] for further evaluation of her microscopic hematuria

## 2015-08-05 NOTE — Telephone Encounter (Signed)
Patient stopped by today because we had been trying to reach her regarding her 07/15/15 u/a result. You had recommended rechecking u/a because of microhematuria but patient points out today that 8/19 result was a recheck from her 07/06/15 u/a.  Both of which had blood in them. She questioned the value of checking it yet a 3rd time?.I told Debarah Crape to tell her I would check with Dr. Glenetta Hew it may well be that he would like to refer her to urologist for work-up for microhematuria instead of rechecking urine.  Patient left and Debarah Crape will call her with Dr. Glenetta Hew recommendation.

## 2015-08-08 NOTE — Telephone Encounter (Signed)
Elizabeth Hernandez notified patient. Victorino Dike informed regarding need for referral and she will arrange and let Debarah Crape know so that Debarah Crape can inform patient.

## 2015-08-09 ENCOUNTER — Telehealth: Payer: Self-pay | Admitting: *Deleted

## 2015-08-09 NOTE — Telephone Encounter (Signed)
-----   Message from Keenan Bachelor, [place] sent at 08/05/2015  4:30 PM EDT ----- Regarding: referral to urologist Per Dr. Glenetta Hew "Lets go ahead and refer her to [facility] for further evaluation of her microscopic hematuria.":  Patient said to ask them to please call her after 4:00pm.

## 2015-08-09 NOTE — Telephone Encounter (Signed)
Appointment on 08/29/15 @ 10:15am Claudia relayed to pt

## 2015-08-09 NOTE — Telephone Encounter (Signed)
Referral faxed to [facility] they will contact pt to schedule. 

## 2015-08-26 ENCOUNTER — Other Ambulatory Visit: Payer: Self-pay

## 2015-08-26 DIAGNOSIS — Z1231 Encounter for screening mammogram for malignant neoplasm of breast: Secondary | ICD-10-CM

## 2015-10-03 ENCOUNTER — Ambulatory Visit
Admission: RE | Admit: 2015-10-03 | Discharge: 2015-10-03 | Disposition: A | Discharge status: Home / Self Care | Payer: 59 | Source: Ambulatory Visit

## 2015-10-03 DIAGNOSIS — Z1231 Encounter for screening mammogram for malignant neoplasm of breast: Secondary | ICD-10-CM

## 2016-04-24 ENCOUNTER — Ambulatory Visit (INDEPENDENT_AMBULATORY_CARE_PROVIDER_SITE_OTHER): Payer: 59 | Admitting: Urgent Care

## 2016-04-24 VITALS — BP 126/86 | HR 82 | Temp 97.8°F | Resp 16 | Ht 60.03 in | Wt 183.6 lb

## 2016-04-24 DIAGNOSIS — R05 Cough: Secondary | ICD-10-CM

## 2016-04-24 DIAGNOSIS — R059 Cough, unspecified: Secondary | ICD-10-CM

## 2016-04-24 DIAGNOSIS — J029 Acute pharyngitis, unspecified: Secondary | ICD-10-CM

## 2016-04-24 DIAGNOSIS — J019 Acute sinusitis, unspecified: Secondary | ICD-10-CM | POA: Diagnosis not present

## 2016-04-24 MED ORDER — CETIRIZINE HCL 10 MG PO TABS: 10.0000 mg | ORAL_TABLET | Freq: Every day | ORAL | Status: DC

## 2016-04-24 MED ORDER — BENZONATATE 100 MG PO CAPS: 100.0000 mg | ORAL_CAPSULE | Freq: Three times a day (TID) | ORAL | Status: DC | PRN

## 2016-04-24 MED ORDER — PSEUDOEPHEDRINE HCL ER 120 MG PO TB12: 120.0000 mg | ORAL_TABLET | Freq: Two times a day (BID) | ORAL | Status: DC

## 2016-04-24 MED ORDER — HYDROCODONE-HOMATROPINE 5-1.5 MG/5ML PO SYRP: 5.0000 mL | ORAL_SOLUTION | Freq: Every evening | ORAL | Status: DC | PRN

## 2016-04-24 MED ORDER — AMOXICILLIN 875 MG PO TABS: 875.0000 mg | ORAL_TABLET | Freq: Two times a day (BID) | ORAL | Status: DC

## 2016-04-24 NOTE — Progress Notes (Signed)
    MRN: [id number] DOB: 1966-07-16  Subjective:   Elizabeth Hernandez is a 50 y.o. female presenting for chief complaint of Allergies  Reports 1 week history of worsening productive cough, sore throat, nasal congestion, sinus headache. Cough elicits sore throat, wheezing in throat. Has tried NyQuil and Mucinex with minimal relief. Denies fever, chest pain, shob, n/v, abdominal pain, ear pain, ear drainage. Denies smoking cigarettes or drinking alcohol.   Elizabeth Hernandez currently has no medications in their medication list. Also has No Known Allergies.  Elizabeth Hernandez  has a past medical history of NSVD (normal spontaneous vaginal delivery) and SAB (spontaneous abortion). Also  has past surgical history that includes Dilation and evacuation and Total abdominal hysterectomy.  Her family history includes Diabetes in her maternal aunt, maternal grandmother, maternal uncle, mother, paternal aunt, and paternal uncle.   Objective:   Vitals: BP 126/86 mmHg  Pulse 82  Temp(Src) 97.8 F (36.6 C) (Oral)  Resp 16  Ht 5' 0.02" (1.525 m)  Wt 183 lb 9.6 oz (83.28 kg)  BMI 35.81 kg/m2  SpO2 98%  Physical Exam  Constitutional: She is oriented to person, place, and time. She appears well-developed and well-nourished.  HENT:  TM's intact bilaterally, no effusions or erythema. Nasal pink and moist with clear mucus. Frontal sinus tenderness. Mild postnasal drip present, without oropharyngeal exudates, erythema or abscesses.  Eyes: Right eye exhibits no discharge. Left eye exhibits no discharge. No scleral icterus.  Neck: Normal range of motion. Neck supple.  Cardiovascular: Normal rate, regular rhythm and intact distal pulses.  Exam reveals no gallop and no friction rub.   No murmur heard. Pulmonary/Chest: No respiratory distress. She has no wheezes. She has no rales.  Lymphadenopathy:    She has no cervical adenopathy.  Neurological: She is alert and oriented to person, place, and time.  Skin: Skin  is warm and dry.    Assessment and Plan :   1. Acute sinusitis, recurrence not specified, unspecified location 2. Cough 3. Sore throat - Likely undergoing viral sinusitis. Start supportive care with Zyrtec, Sudafed, Hycodan and Tessalon. Fill script for amoxicillin if no improvement in 1 week. RTC thereafter if symptoms persist despite antibiotic course.  Wallis BambergMario Bruno Leach, PA-C [facility] [phone] 04/24/2016 2:13 PM

## 2016-04-24 NOTE — Patient Instructions (Addendum)
Sinusitis en adultos (Sinusitis, Adult) La sinusitis es el enrojecimiento, el dolor y la inflamacin de los senos paranasales. Los senos paranasales son cavidades de aire que estn dentro de los huesos de la cara. Se encuentran debajo de los ojos, en la mitad de la frente y encima de los ojos. En los senos paranasales sanos, el moco puede drenar y el aire circula a travs de ellos en su camino hacia la nariz. Sin embargo, cuando se inflaman, el moco y el aire quedan atrapados. Esto hace que se desarrollen bacterias y otros grmenes que causan infeccin. La sinusitis puede desarrollarse rpidamente y durar solo un tiempo corto (aguda) o continuar por un perodo largo (crnica). La sinusitis que dura ms de 12 semanas se considera crnica. CAUSAS Las causas de la sinusitis son:  Alergias.  Las anomalas estructurales, como el desplazamiento del cartlago que separa las fosas nasales (desvo del tabique), que puede disminuir el flujo de aire por la nariz y los senos paranasales, y afectar su drenaje.  Las anomalas funcionales, como cuando los pequeos pelos (cilias) que se encuentran en los senos paranasales y ayudan a eliminar el moco no funcionan correctamente o no estn presentes. SIGNOS Y SNTOMAS Los sntomas de la sinusitis aguda y crnica son los mismos. Los sntomas principales son el dolor y la presin alrededor de los senos paranasales afectados. Otros sntomas son:  Dolor en los dientes superiores.  Dolor de odos.  Dolor de cabeza.  Mal aliento.  Disminucin del sentido del olfato y del gusto.  Tos, que empeora al acostarse.  Fatiga.  Fiebre.  Drenaje de moco espeso por la nariz, que generalmente es de color verde y puede contener pus (purulento).  Hinchazn y calor en los senos paranasales afectados. DIAGNSTICO Su mdico le realizar un examen fsico. Durante el examen, el mdico puede hacer cualquiera de estas cosas para determinar si usted tiene sinusitis aguda o  crnica:  Le revisar la nariz para buscar signos de crecimientos anormales en las fosas nasales (plipos nasales).  Palpar los senos paranasales afectados para buscar signos de infeccin.  Observar la parte interna de los senos paranasales con un dispositivo que tiene una luz (endoscopio). Si el mdico sospecha que usted sufre sinusitis crnica, podr indicar una o ms de las siguientes pruebas:  Pruebas de alergia.  Cultivo de las secreciones nasales. Se extrae una muestra de moco de la nariz, que se enva al laboratorio para detectar bacterias.  Citologa nasal. Se extrae una muestra de moco de la nariz, que el mdico examina para determinar si la sinusitis est relacionada con una alergia. TRATAMIENTO La mayora de los casos de sinusitis aguda se deben a una infeccin viral y se resuelven espontneamente en un perodo de 10das. A veces, se recetan medicamentos como ayuda para aliviar los sntomas tanto de la sinusitis aguda como de la crnica. Estos pueden incluir analgsicos, descongestivos, aerosoles nasales con corticoides o aerosoles de solucin salina. Sin embargo, para la sinusitis por infeccin bacteriana, el mdico le recetar antibiticos. Los antibiticos son medicamentos que destruyen las bacterias que causan la infeccin. Con poca frecuencia, la sinusitis tiene su origen en una infeccin por hongos. En estos casos, el mdico le recetar un medicamento antimictico. Para algunos casos de sinusitis crnica, es necesario someterse a una ciruga. Generalmente, se trata de casos en los que la sinusitis se repite ms de 3veces al ao, a pesar de otros tratamientos. INSTRUCCIONES PARA EL CUIDADO EN EL HOGAR  Beber abundante agua. Los lquidos ayudan a disolver   el moco, para que drene ms fcilmente de los senos paranasales.  Use un humidificador.  Inhale vapor de 3a 4veces al da (por ejemplo, sintese en el bao con la [place]).  Aplquese un pao tibio y hmedo en  la cara 3 o 4veces al da o segn las indicaciones del mdico.  Use un aerosol nasal salino para ayudar a Environmental education officer y [employer] senos nasales.  Tome los medicamentos solamente como se lo haya indicado el mdico.  Si le recetaron un antimictico o un antibitico, asegrese de terminarlos, incluso si comienza a sentirse mejor. SOLICITE ATENCIN MDICA DE INMEDIATO SI:  Siente ms dolor o sufre dolores de cabeza intensos.  Tiene nuseas, vmitos o somnolencia.  Observa hinchazn alrededor del rostro.  Tiene problemas de visin.  Presenta rigidez en el cuello.  Tiene dificultad para respirar.   Esta informacin no tiene Theme park manager el consejo del mdico. Asegrese de hacerle al mdico cualquier pregunta que tenga.   Document Released: 08/22/2005 Document Revised: 12/03/2014 Elsevier Interactive Patient Education 2016 [employer].    Tos en los adultos (Cough, Adult) La tos es un reflejo que limpia la garganta y las vas respiratorias, y ayuda a la curacin y Training and development officer de los pulmones. Es normal toser de Teacher, English as a foreign language, pero cuando esta se presenta con otros sntomas o dura mucho tiempo puede ser el signo de una enfermedad que [place]. La tos puede durar solo 2 o 3semanas (aguda) o ms de 8semanas (crnica). CAUSAS Comnmente, las causas de la tos son las siguientes:  Visual merchandiser sustancias que [employer].  Una infeccin respiratoria viral o bacteriana.  Alergias.  Asma.  Goteo posnasal.  Fumar.  El retroceso de cido estomacal hacia el esfago (reflujo gastroesofgico).  Algunos medicamentos.  Los problemas pulmonares crnicos, entre ellos, la enfermedad pulmonar obstructiva crnica (EPOC) (o, en contadas ocasiones, el cncer de pulmn).  Otras afecciones, como la insuficiencia cardaca. INSTRUCCIONES PARA EL CUIDADO EN EL HOGAR  Est atento a cualquier cambio en los sntomas. Tome estas medidas para Paramedic las molestias:  Tome  los medicamentos solamente como se lo haya indicado el mdico.  Si le recetaron un antibitico, tmelo como se lo haya indicado el mdico. No deje de tomar los antibiticos aunque comience a sentirse mejor.  Hable con el mdico antes de tomar un antitusivo.  Beba suficiente lquido para Photographer orina clara o de color amarillo plido.  Si el aire est seco, use un vaporizador o un humidificador con vapor fro en su habitacin o en su casa para ayudar a aflojar las secreciones.  Evite todas las cosas que le producen tos en el trabajo o en su casa.  Si la tos aumenta durante la noche, intente dormir semisentado.  Evite el humo del cigarrillo. Si fuma, deje de hacerlo. Si necesita ayuda para dejar de fumar, consulte al mdico.  Evite la cafena.  Evite el alcohol.  Descanse todo lo que sea necesario. SOLICITE ATENCIN MDICA SI:   Aparecen nuevos sntomas.  Expectora pus al toser.  La tos no mejora despus de 2 o 3semanas, o empeora.  No puede controlar la tos con antitusivos y no puede dormir bien.  Tiene un dolor que se intensifica o que no puede Sales promotion account executive.  Tiene fiebre.  Baja de peso sin causa aparente.  Tiene transpiracin nocturna. SOLICITE ATENCIN MDICA DE INMEDIATO SI:  Tose y escupe sangre.  Tiene dificultad para respirar.  Los latidos cardacos son muy rpidos.  Esta informacin no tiene Theme park managercomo fin reemplazar el consejo del mdico. Asegrese de hacerle al mdico cualquier pregunta que tenga.   Document Released: 06/20/2011 Document Revised: 08/03/2015 Elsevier Interactive Patient Education [employer]2016 Elsevier Inc.     IF you received an x-ray today, you will receive an invoice from [facility]. Please contact [facility] at [phone] with questions or concerns regarding your invoice.   IF you received labwork today, you will receive an invoice from [facility]. Please contact [facility] at  [phone] with questions or concerns regarding your invoice.   Our billing staff will not be able to assist you with questions regarding bills from these companies.  You will be contacted with the lab results as soon as they are available. The fastest way to get your results is to activate your My Chart account. Instructions are located on the last page of this paperwork. If you have not heard from us regarding the results in 2 weeks, please contact this office.

## 2016-07-06 ENCOUNTER — Encounter: Payer: Self-pay | Admitting: Gynecology

## 2016-07-06 ENCOUNTER — Ambulatory Visit (INDEPENDENT_AMBULATORY_CARE_PROVIDER_SITE_OTHER): Payer: 59 | Admitting: Gynecology

## 2016-07-06 VITALS — BP 124/80 | Ht 60.0 in | Wt 178.0 lb

## 2016-07-06 DIAGNOSIS — Z01419 Encounter for gynecological examination (general) (routine) without abnormal findings: Secondary | ICD-10-CM

## 2016-07-06 LAB — COMPREHENSIVE METABOLIC PANEL
ALT: 15 U/L (ref 6–29)
AST: 21 U/L (ref 10–35)
Albumin: 3.7 g/dL (ref 3.6–5.1)
Alkaline Phosphatase: 58 U/L (ref 33–115)
BILIRUBIN TOTAL: 0.4 mg/dL (ref 0.2–1.2)
BUN: 14 mg/dL (ref 7–25)
CO2: 26 mmol/L (ref 20–31)
CREATININE: 0.7 mg/dL (ref 0.50–1.10)
Calcium: 9 mg/dL (ref 8.6–10.2)
Chloride: 106 mmol/L (ref 98–110)
GLUCOSE: 98 mg/dL (ref 65–99)
Potassium: 4.8 mmol/L (ref 3.5–5.3)
SODIUM: 139 mmol/L (ref 135–146)
Total Protein: 6.2 g/dL (ref 6.1–8.1)

## 2016-07-06 LAB — CBC WITH DIFFERENTIAL/PLATELET
Basophils Absolute: 44 cells/uL (ref 0–200)
Basophils Relative: 1 %
EOS PCT: 6 %
Eosinophils Absolute: 264 cells/uL (ref 15–500)
HCT: 41.3 % (ref 35.0–45.0)
Hemoglobin: 13.2 g/dL (ref 11.7–15.5)
LYMPHS ABS: 1936 {cells}/uL (ref 850–3900)
LYMPHS PCT: 44 %
MCH: 29.7 pg (ref 27.0–33.0)
MCHC: 32 g/dL (ref 32.0–36.0)
MCV: 93 fL (ref 80.0–100.0)
MPV: 10.7 fL (ref 7.5–12.5)
Monocytes Absolute: 440 cells/uL (ref 200–950)
Monocytes Relative: 10 %
NEUTROS PCT: 39 %
Neutro Abs: 1716 cells/uL (ref 1500–7800)
Platelets: 259 10*3/uL (ref 140–400)
RBC: 4.44 MIL/uL (ref 3.80–5.10)
RDW: 14.5 % (ref 11.0–15.0)
WBC: 4.4 10*3/uL (ref 3.8–10.8)

## 2016-07-06 LAB — LIPID PANEL
Cholesterol: 168 mg/dL (ref 125–200)
HDL: 44 mg/dL — ABNORMAL LOW (ref >=46)
LDL CALC: 108 mg/dL (ref <130)
Total CHOL/HDL Ratio: 3.8 Ratio (ref <=5.0)
Triglycerides: 82 mg/dL (ref <150)
VLDL: 16 mg/dL (ref <30)

## 2016-07-06 LAB — TSH: TSH: 3.74 m[IU]/L

## 2016-07-06 NOTE — Progress Notes (Signed)
Elizabeth HoseMargarita Quiterio-Ruiz 1966-07-25 [id number]   History:    50 y.o.  for annual gyn exam with no complaints today. Patient with past history of total abdominal hysterectomy. Patient prior hysterectomy had no history of any abnormal Pap smears. She is here fasting for blood work as well.  Past medical history,surgical history, family history and social history were all reviewed and documented in the EPIC chart.  Gynecologic History No LMP recorded. Patient has had a hysterectomy. Contraception: status post hysterectomy Last Pap: 2012. Results were: normal Last mammogram: 2016. Results were: normal  Obstetric History OB History  Gravida Para Term Preterm AB Living  4 2 2   2 2   SAB TAB Ectopic Multiple Live Births          2    # Outcome Date GA Lbr Len/2nd Weight Sex Delivery Anes PTL Lv  4 AB           3 AB           2 Term     F Vag-Spont  N LIV  1 Term     M Vag-Spont  N LIV       ROS: A ROS was performed and pertinent positives and negatives are included in the history.  GENERAL: No fevers or chills. HEENT: No change in vision, no earache, sore throat or sinus congestion. NECK: No pain or stiffness. CARDIOVASCULAR: No chest pain or pressure. No palpitations. PULMONARY: No shortness of breath, cough or wheeze. GASTROINTESTINAL: No abdominal pain, nausea, vomiting or diarrhea, melena or bright red blood per rectum. GENITOURINARY: No urinary frequency, urgency, hesitancy or dysuria. MUSCULOSKELETAL: No joint or muscle pain, no back pain, no recent trauma. DERMATOLOGIC: No rash, no itching, no lesions. ENDOCRINE: No polyuria, polydipsia, no heat or cold intolerance. No recent change in weight. HEMATOLOGICAL: No anemia or easy bruising or bleeding. NEUROLOGIC: No headache, seizures, numbness, tingling or weakness. PSYCHIATRIC: No depression, no loss of interest in normal activity or change in sleep pattern.     Exam: chaperone present  BP 124/80   Ht 5' (1.524 m)   Wt 178 lb  (80.7 kg)   BMI 34.76 kg/m   Body mass index is 34.76 kg/m.  General appearance : Well developed well nourished female. No acute distress HEENT: Eyes: no retinal hemorrhage or exudates,  Neck supple, trachea midline, no carotid bruits, no thyroidmegaly Lungs: Clear to auscultation, no rhonchi or wheezes, or rib retractions  Heart: Regular rate and rhythm, no murmurs or gallops Breast:Examined in sitting and supine position were symmetrical in appearance, no palpable masses or tenderness,  no skin retraction, no nipple inversion, no nipple discharge, no skin discoloration, no axillary or supraclavicular lymphadenopathy Abdomen: no palpable masses or tenderness, no rebound or guarding Extremities: no edema or skin discoloration or tenderness  Pelvic:  Bartholin, Urethra, Skene Glands: Within normal limits             Vagina: No gross lesions or discharge  Cervix: Absent  Uterus  absent  Adnexa  Without masses or tenderness  Anus and perineum  normal   Rectovaginal  normal sphincter tone without palpated masses or tenderness             Hemoccult not indicated     Assessment/Plan:  50 y.o. female for annual exam will have the following screening blood work today: Fasting lipid profile, comprehensive metabolic panel, TSH, CBC, and urinalysis. Pap smear not indicated. We discussed importance of monthly breast exam. Patient to  schedule her mammogram in November of this year. Patient to follow-up with orthopedics because of shoulder pains that she has been experiencing that been radiating down to her arm she could potentially have an impingement of her rotator cuff.   Ok Edwards MD, 10:10 AM 07/06/2016

## 2016-07-07 LAB — URINALYSIS W MICROSCOPIC + REFLEX CULTURE
BACTERIA UA: NONE SEEN [HPF]
BILIRUBIN URINE: NEGATIVE
CRYSTALS: NONE SEEN [HPF]
Casts: NONE SEEN [LPF]
Glucose, UA: NEGATIVE
HGB URINE DIPSTICK: NEGATIVE
KETONES UR: NEGATIVE
Leukocytes, UA: NEGATIVE
Nitrite: NEGATIVE
Protein, ur: NEGATIVE
SPECIFIC GRAVITY, URINE: 1.022 (ref 1.001–1.035)
Yeast: NONE SEEN [HPF]
pH: 7.5 (ref 5.0–8.0)

## 2016-07-08 LAB — URINE CULTURE: ORGANISM ID, BACTERIA: NO GROWTH

## 2016-08-28 ENCOUNTER — Other Ambulatory Visit: Payer: Self-pay | Admitting: Gynecology

## 2016-08-28 DIAGNOSIS — Z1231 Encounter for screening mammogram for malignant neoplasm of breast: Secondary | ICD-10-CM

## 2016-10-03 ENCOUNTER — Ambulatory Visit
Admission: RE | Admit: 2016-10-03 | Discharge: 2016-10-03 | Disposition: A | Discharge status: Home / Self Care | Payer: 59 | Source: Ambulatory Visit | Attending: Gynecology | Admitting: Gynecology

## 2016-10-03 DIAGNOSIS — Z1231 Encounter for screening mammogram for malignant neoplasm of breast: Secondary | ICD-10-CM

## 2016-11-22 ENCOUNTER — Ambulatory Visit (INDEPENDENT_AMBULATORY_CARE_PROVIDER_SITE_OTHER): Payer: 59 | Admitting: Physician Assistant

## 2016-11-22 VITALS — BP 118/74 | HR 93 | Temp 99.2°F | Ht 60.0 in | Wt 178.6 lb

## 2016-11-22 DIAGNOSIS — B9789 Other viral agents as the cause of diseases classified elsewhere: Secondary | ICD-10-CM | POA: Diagnosis not present

## 2016-11-22 DIAGNOSIS — J069 Acute upper respiratory infection, unspecified: Secondary | ICD-10-CM

## 2016-11-22 MED ORDER — GUAIFENESIN ER 1200 MG PO TB12: 1.0000 | ORAL_TABLET | Freq: Two times a day (BID) | ORAL | 1 refills | Status: DC | PRN

## 2016-11-22 MED ORDER — BENZONATATE 100 MG PO CAPS: 100.0000 mg | ORAL_CAPSULE | Freq: Three times a day (TID) | ORAL | 0 refills | Status: DC | PRN

## 2016-11-22 MED ORDER — IPRATROPIUM BROMIDE 0.03 % NA SOLN: 2.0000 | Freq: Two times a day (BID) | NASAL | 0 refills | Status: DC

## 2016-11-22 MED ORDER — HYDROCOD POLST-CPM POLST ER 10-8 MG/5ML PO SUER: 5.0000 mL | Freq: Every evening | ORAL | 0 refills | Status: DC | PRN

## 2016-11-22 NOTE — Patient Instructions (Addendum)
Please hydrate well with 64 oz of water per day. Take medication as prescribed.    Infeccin del tracto respiratorio superior, adultos (Upper Respiratory Infection, Adult) La mayora de las infecciones del tracto respiratorio superior estn causadas por un virus. Un infeccin del tracto respiratorio superior afecta la nariz, la garganta y las vas respiratorias superiores. El tipo ms comn de infeccin del tracto respiratorio superior es el resfro comn. CUIDADOS EN EL HOGAR  Tome los medicamentos solamente como se lo haya indicado el mdico.  A fin de Engineer, materialsaliviar el dolor de garganta, haga grgaras con solucin salina templada o consuma caramelos para la tos, como se lo haya indicado el mdico.  Use un humidificador de vapor clido o inhale el vapor de la ducha para aumentar la humedad del aire. Esto facilitar la respiracin.  Beba suficiente lquido para mantener el pis (orina) claro o de color amarillo plido.  Tome sopas y caldos transparentes.  Siga una dieta saludable.  Descanse todo lo que sea necesario.  Regrese al Aleen Campitrabajo cuando la fiebre haya desaparecido o el mdico le diga que puede [place] Northeasthacerlo.  Es posible que deba quedarse en su casa durante un tiempo prolongado, para no transmitir la infeccin a los dems.  Tambin puede usar un barbijo y lavarse las manos con frecuencia para evitar el contagio del virus.  Si tiene asma, use el inhalador con mayor frecuencia.  No consuma ningn producto que contenga tabaco, lo que incluye cigarrillos, tabaco de Theatre managermascar o Administrator, Civil Servicecigarrillos electrnicos. Si necesita ayuda para dejar de fumar, consulte al mdico. SOLICITE AYUDA SI:  Siente que empeora o que no mejora.  Los medicamentos no logran [employer]bury Automotive Groupaliviar los sntomas.  Tiene escalofros.  La dificultad para Clinical cytogeneticistrespirar es peor.  Tiene mucosidad marrn o roja.  Tiene una secrecin amarilla o marrn de la Clinical cytogeneticistnariz.  Le duele la cara, especialmente al inclinarse hacia adelante.  Tiene  fiebre.  Tiene los ganglios del cuello hinchados.  Siente dolor al tragar.  Tiene zonas blancas en la parte de atrs de la garganta. SOLICITE AYUDA DE INMEDIATO SI:  Los siguientes sntomas son muy intensos o constantes:  Dolor de Turkmenistancabeza.  Dolor de odos.  Dolor en la frente, detrs de los ojos y por encima de los pmulos (dolor sinusal).  Dolor en el pecho.  Tiene enfermedad pulmonar prolongada (crnica) y cualquiera de estos sntomas:  Sibilancias.  Tos prolongada.  Tos con sangre.  Cambio en la mucosidad habitual.  Presenta rigidez en el cuello.  Tiene cambios en:  La visin.  La audicin.  El pensamiento.  El [place]estado de nimo. ASEGRESE DE QUE:  Comprende estas instrucciones.  Controlar su afeccin.  Recibir ayuda de inmediato si no mejora o si empeora. Esta informacin no tiene Theme park managercomo fin reemplazar el consejo del mdico. Asegrese de hacerle al mdico cualquier pregunta que tenga. Document Released: 04/16/2011 Document Revised: 03/29/2015 Document Reviewed: 02/17/2014 Elsevier Interactive Patient Education  2017 [employer].     IF you received an x-ray today, you will receive an invoice from [facility]. Please contact [facility] at [phone] with questions or concerns regarding your invoice.   IF you received labwork today, you will receive an invoice from [facility]. Please contact [facility] at [phone] with questions or concerns regarding your invoice.   Our billing staff will not be able to assist you with questions regarding bills from these companies.  You will be contacted with the lab results as soon as they are available. The fastest way to get your results  is to activate your My Chart account. Instructions are located on the last page of this paperwork. If you have not heard from [place] regarding the results in 2 weeks, please contact this office.

## 2016-11-22 NOTE — Progress Notes (Signed)
[facility] [address], [place] [id number] [phone]  Date:  11/22/2016   Name:  Elizabeth Hernandez   DOB:  03/11/1966   MRN:  [id number]  PCP:  No primary care provider on file.   Chief Complaint  Patient presents with  . Cough    X 5 days with yelow mucus  . Sore Throat    X 2-3 days     History of Present Illness:  Elizabeth Hernandez is a [age] y.o. female patient [facility] cough and sore throat for 5 days.    Cough with productive sputum.      5 days ago with coughing and headache.  Sore throat for 3 days.  Productive yellow mucus.  No fever.  No sob or dyspnea, but does feel a little short with coughing.  Cough more productive in the morning.  No ear pain or nasal congestion.  Some rhinorrhea. She has taken nyquil, and dayquil which did not help.  She took tylenol 4 days ago.   Patient Active Problem List   Diagnosis Date Noted  . Weight gain 06/11/2012  . Family history of diabetes mellitus 06/11/2012    Past Medical History:  Diagnosis Date  . NSVD (normal spontaneous vaginal delivery)    x2  . SAB (spontaneous abortion)     Past Surgical History:  Procedure Laterality Date  . DILATION AND EVACUATION     missed ab  . TOTAL ABDOMINAL HYSTERECTOMY     TAH    Social History  Substance Use Topics  . Smoking status: Never Smoker  . Smokeless tobacco: Never Used  . Alcohol use No    Family History  Problem Relation Age of Onset  . Diabetes Maternal Aunt   . Diabetes Mother   . Diabetes Maternal Uncle   . Diabetes Paternal Aunt   . Diabetes Paternal Uncle   . Diabetes Maternal Grandmother     No Known Allergies  Medication list has been reviewed and updated.  No current outpatient prescriptions on file prior to visit.   No current facility-administered medications on file prior to visit.     ROS ROS otherwise unremarkable unless listed above.    Physical Examination: BP 118/74 (BP  Location: Right Arm, Patient Position: Sitting, Cuff Size: Small)   Pulse 93   Temp 99.2 F (37.3 C) (Oral)   Ht 5' (1.524 m)   Wt 178 lb 9.6 oz (81 kg)   SpO2 98%   BMI 34.88 kg/m  Ideal Body Weight: Weight in (lb) to have BMI = 25: 127.7  Physical Exam  Constitutional: She is oriented to person, place, and time. She appears well-developed and well-nourished. No distress.  HENT:  Head: Normocephalic and atraumatic.  Right Ear: Tympanic membrane, external ear and ear canal normal.  Left Ear: Tympanic membrane, external ear and ear canal normal.  Nose: Mucosal edema and rhinorrhea present. Right sinus exhibits no maxillary sinus tenderness and no frontal sinus tenderness. Left sinus exhibits no maxillary sinus tenderness and no frontal sinus tenderness.  Mouth/Throat: No uvula swelling. No oropharyngeal exudate, posterior oropharyngeal edema or posterior oropharyngeal erythema.  Eyes: Conjunctivae and EOM are normal. Pupils are equal, round, and reactive to light.  Cardiovascular: Normal rate and regular rhythm.  Exam reveals no gallop, no distant heart sounds and no friction rub.   No murmur heard. Pulmonary/Chest: Effort normal and breath sounds normal. No respiratory distress. She has no decreased breath sounds.  She has no wheezes. She has no rhonchi.  Lymphadenopathy:       Head (right side): No submandibular, no tonsillar, no preauricular and no posterior auricular adenopathy present.       Head (left side): No submandibular, no tonsillar, no preauricular and no posterior auricular adenopathy present.  Neurological: She is alert and oriented to person, place, and time.  Skin: She is not diaphoretic.  Psychiatric: She has a normal mood and affect. Her behavior is normal.     Assessment and Plan: Elizabeth Hernandez is a 50 y.o. female who is here today for cough and congestion. Supportively at this time. If there is no improvement within the next 4 days, she may call in for  azithromycin, zpak form.   Viral URI with cough - Plan: Guaifenesin (MUCINEX MAXIMUM STRENGTH) 1200 MG TB12, ipratropium (ATROVENT) 0.03 % nasal spray, benzonatate (TESSALON) 100 MG capsule, chlorpheniramine-HYDROcodone (TUSSIONEX PENNKINETIC ER) 10-8 MG/5ML SUER   Trena PlattStephanie Terryann Verbeek, PA-C [facility] 11/22/2016 2:13 PM

## 2017-04-10 ENCOUNTER — Encounter: Payer: Self-pay | Admitting: Gynecology

## 2017-06-27 ENCOUNTER — Encounter: Payer: Self-pay | Admitting: Gynecology

## 2017-06-27 ENCOUNTER — Ambulatory Visit (INDEPENDENT_AMBULATORY_CARE_PROVIDER_SITE_OTHER): Payer: 59 | Admitting: Gynecology

## 2017-06-27 VITALS — BP 118/70 | Ht 60.0 in | Wt 173.6 lb

## 2017-06-27 DIAGNOSIS — Z01419 Encounter for gynecological examination (general) (routine) without abnormal findings: Secondary | ICD-10-CM

## 2017-06-27 DIAGNOSIS — N951 Menopausal and female climacteric states: Secondary | ICD-10-CM | POA: Diagnosis not present

## 2017-06-27 LAB — COMPREHENSIVE METABOLIC PANEL
ALBUMIN: 3.8 g/dL (ref 3.6–5.1)
ALT: 14 U/L (ref 6–29)
AST: 20 U/L (ref 10–35)
Alkaline Phosphatase: 50 U/L (ref 33–115)
BUN: 19 mg/dL (ref 7–25)
CALCIUM: 8.8 mg/dL (ref 8.6–10.2)
CHLORIDE: 106 mmol/L (ref 98–110)
CO2: 21 mmol/L (ref 20–31)
CREATININE: 0.75 mg/dL (ref 0.50–1.10)
Glucose, Bld: 81 mg/dL (ref 65–99)
POTASSIUM: 3.8 mmol/L (ref 3.5–5.3)
Sodium: 137 mmol/L (ref 135–146)
Total Bilirubin: 0.5 mg/dL (ref 0.2–1.2)
Total Protein: 6.3 g/dL (ref 6.1–8.1)

## 2017-06-27 LAB — CBC WITH DIFFERENTIAL/PLATELET
BASOS ABS: 57 {cells}/uL (ref 0–200)
Basophils Relative: 1 %
Eosinophils Absolute: 171 cells/uL (ref 15–500)
Eosinophils Relative: 3 %
HEMATOCRIT: 38.3 % (ref 35.0–45.0)
HEMOGLOBIN: 12.5 g/dL (ref 11.7–15.5)
LYMPHS ABS: 2451 {cells}/uL (ref 850–3900)
Lymphocytes Relative: 43 %
MCH: 30.5 pg (ref 27.0–33.0)
MCHC: 32.6 g/dL (ref 32.0–36.0)
MCV: 93.4 fL (ref 80.0–100.0)
MONO ABS: 342 {cells}/uL (ref 200–950)
MPV: 10.4 fL (ref 7.5–12.5)
Monocytes Relative: 6 %
NEUTROS PCT: 47 %
Neutro Abs: 2679 cells/uL (ref 1500–7800)
Platelets: 293 10*3/uL (ref 140–400)
RBC: 4.1 MIL/uL (ref 3.80–5.10)
RDW: 14.5 % (ref 11.0–15.0)
WBC: 5.7 10*3/uL (ref 3.8–10.8)

## 2017-06-27 LAB — LIPID PANEL
CHOL/HDL RATIO: 3.3 ratio (ref <5.0)
Cholesterol: 170 mg/dL (ref <200)
HDL: 52 mg/dL (ref >50)
LDL CALC: 107 mg/dL — AB (ref <100)
TRIGLYCERIDES: 53 mg/dL (ref <150)
VLDL: 11 mg/dL (ref <30)

## 2017-06-27 LAB — TSH: TSH: 2.75 mIU/L

## 2017-06-27 MED ORDER — HYDROCORTISONE 1 % EX OINT: 1.0000 "application " | TOPICAL_OINTMENT | Freq: Two times a day (BID) | CUTANEOUS | 0 refills | Status: DC

## 2017-06-27 NOTE — Progress Notes (Signed)
Elizabeth HoseMargarita Hernandez [birth year] [id number]   History:    51 y.o.  for annual gyn exam with complaining of occasional hot flashes.Patient with past history of total abdominal hysterectomy. Patient prior hysterectomy had no history of any abnormal Pap smears. She is here fasting for blood work as well.  Past medical history,surgical history, family history and social history were all reviewed and documented in the EPIC chart.  Gynecologic History No LMP recorded. Patient has had a hysterectomy. Contraception: status post hysterectomy Last Pap: 2012. Results were: normal Last mammogram: 2017. Results were: normal  Obstetric History OB History  Gravida Para Term Preterm AB Living  4 2 2   2 2   SAB TAB Ectopic Multiple Live Births          2    # Outcome Date GA Lbr Len/2nd Weight Sex Delivery Anes PTL Lv  4 AB           3 AB           2 Term     F Vag-Spont  N LIV  1 Term     M Vag-Spont  N LIV       ROS: A ROS was performed and pertinent positives and negatives are included in the history.  GENERAL: No fevers or chills. HEENT: No change in vision, no earache, sore throat or sinus congestion. NECK: No pain or stiffness. CARDIOVASCULAR: No chest pain or pressure. No palpitations. PULMONARY: No shortness of breath, cough or wheeze. GASTROINTESTINAL: No abdominal pain, nausea, vomiting or diarrhea, melena or bright red blood per rectum. GENITOURINARY: No urinary frequency, urgency, hesitancy or dysuria. MUSCULOSKELETAL: No joint or muscle pain, no back pain, no recent trauma. DERMATOLOGIC: No rash, no itching, no lesions. ENDOCRINE: No polyuria, polydipsia, no heat or cold intolerance. No recent change in weight. HEMATOLOGICAL: No anemia or easy bruising or bleeding. NEUROLOGIC: No headache, seizures, numbness, tingling or weakness. PSYCHIATRIC: No depression, no loss of interest in normal activity or change in sleep pattern.     Exam: chaperone present  BP 118/70   Ht 5' (1.524  m)   Wt 173 lb 9.6 oz (78.7 kg)   BMI 33.90 kg/m   Body mass index is 33.9 kg/m.  General appearance : Well developed well nourished female. No acute distress HEENT: Eyes: no retinal hemorrhage or exudates,  Neck supple, trachea midline, no carotid bruits, no thyroidmegaly Lungs: Clear to auscultation, no rhonchi or wheezes, or rib retractions  Heart: Regular rate and rhythm, no murmurs or gallops Breast:Examined in sitting and supine position were symmetrical in appearance, no palpable masses or tenderness,  no skin retraction, no nipple inversion, no nipple discharge, no skin discoloration, no axillary or supraclavicular lymphadenopathy Abdomen: no palpable masses or tenderness, no rebound or guarding Extremities: no edema or skin discoloration or tenderness  Pelvic:  Bartholin, Urethra, Skene Glands: Within normal limits             Vagina: No gross lesions or discharge  Cervix: Absent  Uterus  absent  Adnexa  Without masses or tenderness  Anus and perineum  normal   Rectovaginal  normal sphincter tone without palpated masses or tenderness             Hemoccult not indicated     Assessment/Plan:  51 y.o. female for annual exam perimenopausal we'll check an FSH along with the following fasting blood work: Comprehensive metabolic panel, fasting lipid profile, TSH, CBC, and urinalysis. Patient was reminded to schedule her  mammogram. We discussed importance of calcium vitamin D and weightbearing exercises for osteoporosis prevention. Literature on the menopause and on hormonal replacement therapy was provided.   Ok EdwardsFERNANDEZ,JUAN H MD, 4:50 PM 06/27/2017

## 2017-06-27 NOTE — Patient Instructions (Signed)
Menopausia y terapia de reemplazo hormonal (Menopause and Hormone Replacement Therapy) QU ES LA TERAPIA DE REEMPLAZO HORMONAL? La terapia de reemplazo hormonal (TRH) es el uso de hormonas artificiales (sintticas) para reemplazar las hormonas que el cuerpo deja de producir durante la menopausia. La menopausia es la etapa normal de la vida cuando los perodos menstruales desaparecen y los ovarios dejan de producir hormonas femeninas, estrgenos y progesterona. Esta falta de hormonas puede afectar la salud y causar sntomas indeseables. La TRH puede aliviar algunos de esos sntomas. CULES SON MIS OPCIONES PARA UNA TRH? La TRH puede consistir en hormonas sintticas de estrgeno y progesterona o puede consistir solamente en estrgeno (terapia de estrgenos solamente). Usted y su mdico decidirn qu tipo de TRH es la ms adecuada para usted. Si elige tener una TRH y tiene tero, generalmente se prescribe estrgeno y progesterona. La terapia de estrgenos solamente se usa para las mujeres que no tienen tero. Las siguientes son algunas de las opciones para una TRH:  Pldoras.  Parches.  Geles.  Aerosoles.  Cremas vaginales.  Anillo vaginal.  Dispositivos vaginales. La cantidad de hormonas que tome y el tiempo en que deba tomarlas depender de su salud en particular. Es importante:  Comenzar una TRH con la dosificacin ms baja posible.  Interrumpa la TRH tan pronto como su mdico se lo indique.  Colabore con su mdico para estar bien informado y sentirse cmodo con sus decisiones. CULES SON LOS BENEFICIOS DE LA TRH? La TRH puede reducir la frecuencia y la gravedad de los sntomas de la menopausia. Los beneficios de la TRH pueden variar segn los sntomas de la menopausia que tenga, la gravedad de estos y su salud general. La TRH puede ayudarle a mejorar los siguientes sntomas de la menopausia:  Sofocones y sudores nocturnos. Estos consisten en una sensacin de calor que se  extiende por el rostro y el cuerpo. La piel se puede volver roja, como ruborizada. Los sudores nocturnos son oleadas de calor que ocurren al estar dormida o tratando de dormir.  Prdida de masa sea (osteoporosis). El organismo pierde calcio ms rpido despus de la menopausia, lo que debilita a los huesos. Esto puede aumentar el riesgo de huesos rotos (fracturas).  Sequedad vaginal. La membrana de la vagina se hace ms delgada y seca lo que puede causar dolor durante las relaciones sexuales o causar infecciones, ardor o picazn.  Infecciones en las vas urinarias.  Incontinencia urinaria. Esta es la disminucin de la capacidad de controlar la orina.  Irritabilidad.  Problemas de memoria de corto plazo. CULES SON LOS RIESGOS DE LA TRH? Los riesgos de la TRH pueden variar segn su salud y su historia clnica. Los riesgos de la TRH tambin dependen de si usted recibe estrgeno y progesterona o solamente estrgeno.La TRH puede aumentar el riesgo de:  Tener prdidas. Estas ocurren cuando la vagina pierde repentinamente una pequea cantidad de sangre.  Cncer de endometrio. Este cncer ocurre en la membrana del tero (endometrio).  Cncer de mama.  Aumenta la densidad del tejido de las mamas. Esto puede dificultar la deteccin de cncer de mamas con una radiografa de mamas (mamografa).  Ictus.  Infarto de miocardio.  Cogulos sanguneos.  Enfermedad de la vescula biliar. Los riesgos de la TRH pueden ser mayores si tiene alguna de las siguientes afecciones:  Cncer de endometrio.  Enfermedad heptica.  Cardiopata.  Cncer de mama.  Antecedentes de cogulos sanguneos.  Antecedentes de ictus. CMO DEBO CUIDARME CUANDO ESTOY REALIZANDO UNA TRH?  Tome los   medicamentos de venta libre y los recetados solamente como se lo haya indicado el mdico.  Hgase mamografas, exmenes plvicos y chequeos con la frecuencia que le indique el mdico.  Hgase pruebas de Papanicolu  con la frecuencia que le indique el mdico. A esta prueba tambin se la denomina "Pap". Es una prueba de deteccin que se utiliza para detectar signos de cncer en el cuello del tero y la vagina. La prueba de Papanicolu tambin puede identificar la presencia de infeccin o cambios precancerosos. Las pruebas de Papanicolaou se pueden realizar: ? Cada 3 aos, a partir de los 21 aos. ? Cada 5 aos, a partir de los 30 aos, en combinacin con las pruebas que se realizan para detectar la presencia del virus del papiloma humano (VPH). ? Con mayor o menor frecuencia segn las afecciones mdicas que tenga, su edad y otros factores de riesgo.  Es su responsabilidad retirar el resultado de la prueba de Papanicolu. Consulte a su mdico o en el departamento donde se realice el procedimiento cundo estarn listos los resultados.  Concurra a todas las visitas de control como se lo haya indicado el mdico. Esto es importante.  CUNDO DEBO BUSCAR ATENCIN MDICA? Hable con el mdico en los siguientes casos:  Tiene alguno de estos sntomas: ? Dolor o hinchazn en las piernas. ? Falta de aire. ? Dolor en el pecho. ? Bultos o cambios en las mamas o en las axilas. ? Hablar arrastrando las palabras. ? Dolor, ardor o sangrado al orinar.  Tiene alguno de estos sntomas: ? Hemorragia vaginal inusual. ? Mareos o dolores de cabeza. ? Adormecimiento o debilidad en cualquier parte de los brazos o de las piernas. ? Dolor en el abdomen. Esta informacin no tiene como fin reemplazar el consejo del mdico. Asegrese de hacerle al mdico cualquier pregunta que tenga. Document Released: 04/30/2008 Document Revised: 03/05/2016 Document Reviewed: 05/16/2015 Elsevier Interactive Patient Education  2017 Elsevier Inc. Perimenopausia (Perimenopause) La perimenopausia es el momento en que su cuerpo comienza a pasar a la menopausia (sin menstruacin durante 12 meses consecutivos). Es un proceso natural. La perimenopausia  puede comenzar entre 2 y 8 aos antes de la menopausia y por lo general tiene una duracin de 1 ao ms pasada la menopausia. Durante este tiempo, los ovarios podran producir un vulo o no. Los ovarios varan su produccin de las hormonas estrgeno y progesterona cada mes. Esto puede causar perodos menstruales irregulares, dificultad para quedar embarazada, hemorragia vaginal entre perodos y sntomas incmodos. CAUSAS  Produccin irregular de las hormonas ovricas estrgeno y progesterona, y no ovular todos los meses.  Otras causas son: ? Tumor de la glndula pituitaria. ? Enfermedades que afectan los ovarios. ? Radioterapia. ? Quimioterapia. ? Causas desconocidas. ? Fumar mucho y abusar del consumo de alcohol puede llevar a que la perimenopausia aparezca antes.  SIGNOS Y SNTOMAS  Acaloramiento.  Sudoracin nocturna.  Perodos menstruales irregulares.  Disminucin del deseo sexual.  Sequedad vaginal.  Dolores de cabeza.  Cambios en el estado de nimo.  Depresin.  Problemas de memoria.  Irritabilidad.  Cansancio.  Aumento de peso.  Problemas para quedar embarazada.  Prdida de clulas seas (osteoporosis).  Comienzo de endurecimiento de las arterias (aterosclerosis).  DIAGNSTICO El mdico realizar un diagnstico en funcin de su edad, historial de perodos menstruales y sntomas. Le realizarn un examen fsico para ver si hay algn cambio en su cuerpo, en especial en sus rganos reproductores. Las pruebas hormonales pueden ser o no tiles segn la cantidad de hormonas femeninas   que produzca y cundo las produzca. Sin embargo, podrn realizarse otras pruebas hormonales para detectar otros problemas. TRATAMIENTO En algunos casos, no se necesita tratamiento. La decisin acerca de qu tratamiento es necesario durante la perimenopausia deber realizarse en conjunto con su mdico segn cmo estn afectando los sntomas a su estilo de vida. Existen varios tratamientos  disponibles, como:  Tratar cada sntoma individual con medicamentos especficos para ese sntoma.  Algunos medicamentos herbales pueden ayudar en sntomas especficos.  Psicoterapia.  Terapia grupal. INSTRUCCIONES PARA EL CUIDADO EN EL HOGAR  Controle sus periodos menstruales (cundo ocurren, qu tan abundantes son, cunto tiempo pasa entre perodos, y cunto duran) como tambin sus sntomas y cundo comenzaron.  Tome slo medicamentos de venta libre o recetados, segn las indicaciones del mdico.  Duerma y descanse.  Haga actividad fsica.  Consuma una dieta que contenga calcio (bueno para los huesos) y productos derivados de la soja (actan como estrgenos).  No fume.  Evite las bebidas alcohlicas.  Tome los suplementos vitamnicos segn las indicaciones del mdico. En ciertos casos, puede ser de ayuda tomar vitamina E.  Tome suplementos de calcio y vitamina D para ayudar a prevenir la prdida sea.  En algunos casos la terapia de grupo podr ayudarla.  La acupuntura puede ser de ayuda en ciertos casos.  SOLICITE ATENCIN MDICA SI:  Tiene preguntas acerca de sus sntomas.  Necesita ser derivada a un especialista (gineclogo, psiquiatra, o psiclogo).  SOLICITE ATENCIN MDICA DE INMEDIATO SI:  Sufre una hemorragia vaginal abundante.  Su perodo menstrual dura ms de 8 das.  Sus perodos son recurrentes cada menos de 21 das.  Tiene hemorragias durante las relaciones sexuales.  Est muy deprimido.  Siente dolor al orinar.  Siente dolor de cabeza intenso.  Tiene problemas de visin.  Esta informacin no tiene como fin reemplazar el consejo del mdico. Asegrese de hacerle al mdico cualquier pregunta que tenga. Document Released: 11/12/2005 Document Revised: 09/02/2013 Document Reviewed: 06/11/2013 Elsevier Interactive Patient Education  2017 Elsevier Inc.  

## 2017-06-28 LAB — URINALYSIS W MICROSCOPIC + REFLEX CULTURE
Bacteria, UA: NONE SEEN [HPF]
Bilirubin Urine: NEGATIVE
Casts: NONE SEEN [LPF]
Crystals: NONE SEEN [HPF]
Glucose, UA: NEGATIVE
Hgb urine dipstick: NEGATIVE
Leukocytes, UA: NEGATIVE
Nitrite: NEGATIVE
RBC / HPF: NONE SEEN RBC/HPF (ref <=2)
Specific Gravity, Urine: 1.01 (ref 1.001–1.035)
WBC, UA: NONE SEEN WBC/HPF (ref <=5)
Yeast: NONE SEEN [HPF]
pH: 7 (ref 5.0–8.0)

## 2017-06-28 LAB — FOLLICLE STIMULATING HORMONE: FSH: 7 m[IU]/mL

## 2017-08-22 ENCOUNTER — Other Ambulatory Visit: Payer: Self-pay | Admitting: Obstetrics & Gynecology

## 2017-08-22 DIAGNOSIS — Z1231 Encounter for screening mammogram for malignant neoplasm of breast: Secondary | ICD-10-CM

## 2017-10-07 ENCOUNTER — Ambulatory Visit
Admission: RE | Admit: 2017-10-07 | Discharge: 2017-10-07 | Disposition: A | Discharge status: Home / Self Care | Payer: 59 | Source: Ambulatory Visit | Attending: Obstetrics & Gynecology | Admitting: Obstetrics & Gynecology

## 2017-10-07 DIAGNOSIS — Z1231 Encounter for screening mammogram for malignant neoplasm of breast: Secondary | ICD-10-CM

## 2018-08-13 ENCOUNTER — Encounter: Payer: 59 | Admitting: Women's Health

## 2018-09-29 ENCOUNTER — Encounter: Payer: Self-pay | Admitting: Women's Health

## 2018-09-29 ENCOUNTER — Ambulatory Visit (INDEPENDENT_AMBULATORY_CARE_PROVIDER_SITE_OTHER): Payer: 59 | Admitting: Women's Health

## 2018-09-29 VITALS — BP 124/78 | Ht 60.0 in | Wt 172.0 lb

## 2018-09-29 DIAGNOSIS — Z01419 Encounter for gynecological examination (general) (routine) without abnormal findings: Secondary | ICD-10-CM

## 2018-09-29 NOTE — Progress Notes (Signed)
Elizabeth Hernandez Feb 10, 1966 [id number]    History:    Presents for annual exam.  2007 TAH for adenomyosis with good relief of pelvic pain.  Normal Pap and mammogram history.  Has not had a screening colonoscopy.  Normal labs 2018.  Interpreter present  Past medical history, past surgical history, family history and social history were all reviewed and documented in the EPIC chart.  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  Mother and several  family members with diabetes.  ROS:  A ROS was performed and pertinent positives and negatives are included.  Exam:  Vitals:   09/29/18 1422  BP: 124/78  Weight: 172 lb (78 kg)  Height: 5' (1.524 m)   Body mass index is 33.59 kg/m.   General appearance:  Normal Thyroid:  Symmetrical, normal in size, without palpable masses or nodularity. Respiratory  Auscultation:  Clear without wheezing or rhonchi Cardiovascular  Auscultation:  Regular rate, without rubs, murmurs or gallops  Edema/varicosities:  Not grossly evident Abdominal  Soft,nontender, without masses, guarding or rebound.  Liver/spleen:  No organomegaly noted  Hernia:  None appreciated  Skin  Inspection:  Grossly normal   Breasts: Examined lying and sitting.     Right: Without masses, retractions, discharge or axillary adenopathy.     Left: Without masses, retractions, discharge or axillary adenopathy. Gentitourinary   Inguinal/mons:  Normal without inguinal adenopathy  External genitalia:  Normal  BUS/Urethra/Skene's glands:  Normal  Vagina:  Normal  Cervix: And uterus absent  Adnexa/parametria:     Rt: Without masses or tenderness.   Lt: Without masses or tenderness.  Anus and perineum: Normal  Digital rectal exam: Normal sphincter tone without palpated masses or tenderness  Assessment/Plan:  52 y.o. MB F G4 P2 for annual exam with no complaints.  2007 TAH for adenomyosis with good relief of pelvic pain Obesity  Plan: SBE's, continue  annual screening mammogram due this month.  Increase regular exercise, decrease calorie/carbs calcium rich foods, vitamin D 2000 daily encouraged.  Screening colonoscopy reviewed, [facility] information given, instructed to schedule.  CBC, CMP,  will come fasting next year for lipid panel normal 2018.    Harrington Challenger [facility], 3:10 PM 09/29/2018

## 2018-09-29 NOTE — Patient Instructions (Signed)
Colonoscopy  [facility]  Dr Carlean Purl  [phone] Vit d 2000 iu daily  Health Maintenance, Female Adopting a healthy lifestyle and getting preventive care can go a long way to promote health and wellness. Talk with your health care provider about what schedule of regular examinations is right for you. This is a good chance for you to check in with your provider about disease prevention and staying healthy. In between checkups, there are plenty of things you can do on your own. Experts have done a lot of research about which lifestyle changes and preventive measures are most likely to keep you healthy. Ask your health care provider for more information. Weight and diet Eat a healthy diet  Be sure to include plenty of vegetables, fruits, low-fat dairy products, and lean protein.  Do not eat a lot of foods high in solid fats, added sugars, or salt.  Get regular exercise. This is one of the most important things you can do for your health. ? Most adults should exercise for at least 150 minutes each week. The exercise should increase your heart rate and make you sweat (moderate-intensity exercise). ? Most adults should also do strengthening exercises at least twice a week. This is in addition to the moderate-intensity exercise.  Maintain a healthy weight  Body mass index (BMI) is a measurement that can be used to identify possible weight problems. It estimates body fat based on height and weight. Your health care provider can help determine your BMI and help you achieve or maintain a healthy weight.  For females 81 years of age and older: ? A BMI below 18.5 is considered underweight. ? A BMI of 18.5 to 24.9 is normal. ? A BMI of 25 to 29.9 is considered overweight. ? A BMI of 30 and above is considered obese.  Watch levels of cholesterol and blood lipids  You should start having your blood tested for lipids and cholesterol at 51 years of age, then have this test every 5 years.  You may need to  have your cholesterol levels checked more often if: ? Your lipid or cholesterol levels are high. ? You are older than 51 years of age. ? You are at high risk for heart disease.  Cancer screening Lung Cancer  Lung cancer screening is recommended for adults 45-25 years old who are at high risk for lung cancer because of a history of smoking.  A yearly low-dose CT scan of the lungs is recommended for people who: ? Currently smoke. ? Have quit within the past 15 years. ? Have at least a 30-pack-year history of smoking. A pack year is smoking an average of one pack of cigarettes a day for 1 year.  Yearly screening should continue until it has been 15 years since you quit.  Yearly screening should stop if you develop a health problem that would prevent you from having lung cancer treatment.  Breast Cancer  Practice breast self-awareness. This means understanding how your breasts normally appear and feel.  It also means doing regular breast self-exams. Let your health care provider know about any changes, no matter how small.  If you are in your 20s or 30s, you should have a clinical breast exam (CBE) by a health care provider every 1-3 years as part of a regular health exam.  If you are 80 or older, have a CBE every year. Also consider having a breast X-ray (mammogram) every year.  If you have a family history of breast cancer, talk  to your health care provider about genetic screening.  If you are at high risk for breast cancer, talk to your health care provider about having an MRI and a mammogram every year.  Breast cancer gene (BRCA) assessment is recommended for women who have family members with BRCA-related cancers. BRCA-related cancers include: ? Breast. ? Ovarian. ? Tubal. ? Peritoneal cancers.  Results of the assessment will determine the need for genetic counseling and BRCA1 and BRCA2 testing.  Cervical Cancer Your health care provider may recommend that you be screened  regularly for cancer of the pelvic organs (ovaries, uterus, and vagina). This screening involves a pelvic examination, including checking for microscopic changes to the surface of your cervix (Pap test). You may be encouraged to have this screening done every 3 years, beginning at age 21.  For women ages 30-65, health care providers may recommend pelvic exams and Pap testing every 3 years, or they may recommend the Pap and pelvic exam, combined with testing for human papilloma virus (HPV), every 5 years. Some types of HPV increase your risk of cervical cancer. Testing for HPV may also be done on women of any age with unclear Pap test results.  Other health care providers may not recommend any screening for nonpregnant women who are considered low risk for pelvic cancer and who do not have symptoms. Ask your health care provider if a screening pelvic exam is right for you.  If you have had past treatment for cervical cancer or a condition that could lead to cancer, you need Pap tests and screening for cancer for at least 20 years after your treatment. If Pap tests have been discontinued, your risk factors (such as having a new sexual partner) need to be reassessed to determine if screening should resume. Some women have medical problems that increase the chance of getting cervical cancer. In these cases, your health care provider may recommend more frequent screening and Pap tests.  Colorectal Cancer  This type of cancer can be detected and often prevented.  Routine colorectal cancer screening usually begins at 50 years of age and continues through 51 years of age.  Your health care provider may recommend screening at an earlier age if you have risk factors for colon cancer.  Your health care provider may also recommend using home test kits to check for hidden blood in the stool.  A small camera at the end of a tube can be used to examine your colon directly (sigmoidoscopy or colonoscopy). This is  done to check for the earliest forms of colorectal cancer.  Routine screening usually begins at age 50.  Direct examination of the colon should be repeated every 5-10 years through 51 years of age. However, you may need to be screened more often if early forms of precancerous polyps or small growths are found.  Skin Cancer  Check your skin from head to toe regularly.  Tell your health care provider about any new moles or changes in moles, especially if there is a change in a mole's shape or color.  Also tell your health care provider if you have a mole that is larger than the size of a pencil eraser.  Always use sunscreen. Apply sunscreen liberally and repeatedly throughout the day.  Protect yourself by wearing long sleeves, pants, a wide-brimmed hat, and sunglasses whenever you are outside.  Heart disease, diabetes, and high blood pressure  High blood pressure causes heart disease and increases the risk of stroke. High blood pressure is more   likely to develop in: ? People who have blood pressure in the high end of the normal range (130-139/85-89 mm Hg). ? People who are overweight or obese. ? People who are African American.  If you are 90-95 years of age, have your blood pressure checked every 3-5 years. If you are 4 years of age or older, have your blood pressure checked every year. You should have your blood pressure measured twice-once when you are at a hospital or clinic, and once when you are not at a hospital or clinic. Record the average of the two measurements. To check your blood pressure when you are not at a hospital or clinic, you can use: ? An automated blood pressure machine at a pharmacy. ? A home blood pressure monitor.  If you are between 44 years and 21 years old, ask your health care provider if you should take aspirin to prevent strokes.  Have regular diabetes screenings. This involves taking a blood sample to check your fasting blood sugar level. ? If you are  at a normal weight and have a low risk for diabetes, have this test once every three years after 51 years of age. ? If you are overweight and have a high risk for diabetes, consider being tested at a younger age or more often. Preventing infection Hepatitis B  If you have a higher risk for hepatitis B, you should be screened for this virus. You are considered at high risk for hepatitis B if: ? You were born in a country where hepatitis B is common. Ask your health care provider which countries are considered high risk. ? Your parents were born in a high-risk country, and you have not been immunized against hepatitis B (hepatitis B vaccine). ? You have HIV or AIDS. ? You use needles to inject street drugs. ? You live with someone who has hepatitis B. ? You have had sex with someone who has hepatitis B. ? You get hemodialysis treatment. ? You take certain medicines for conditions, including cancer, organ transplantation, and autoimmune conditions.  Hepatitis C  Blood testing is recommended for: ? Everyone born from [birth year] through 1964. ? Anyone with known risk factors for hepatitis C.  Sexually transmitted infections (STIs)  You should be screened for sexually transmitted infections (STIs) including gonorrhea and chlamydia if: ? You are sexually active and are younger than 51 years of age. ? You are older than 51 years of age and your health care provider tells you that you are at risk for this type of infection. ? Your sexual activity has changed since you were last screened and you are at an increased risk for chlamydia or gonorrhea. Ask your health care provider if you are at risk.  If you do not have HIV, but are at risk, it may be recommended that you take a prescription medicine daily to prevent HIV infection. This is called pre-exposure prophylaxis (PrEP). You are considered at risk if: ? You are sexually active and do not regularly use condoms or know the HIV status of your  partner(s). ? You take drugs by injection. ? You are sexually active with a partner who has HIV.  Talk with your health care provider about whether you are at high risk of being infected with HIV. If you choose to begin PrEP, you should first be tested for HIV. You should then be tested every 3 months for as long as you are taking PrEP. Pregnancy  If you are premenopausal and you may  become pregnant, ask your health care provider about preconception counseling.  If you may become pregnant, take 400 to 800 micrograms (mcg) of folic acid every day.  If you want to prevent pregnancy, talk to your health care provider about birth control (contraception). Osteoporosis and menopause  Osteoporosis is a disease in which the bones lose minerals and strength with aging. This can result in serious bone fractures. Your risk for osteoporosis can be identified using a bone density scan.  If you are 40 years of age or older, or if you are at risk for osteoporosis and fractures, ask your health care provider if you should be screened.  Ask your health care provider whether you should take a calcium or vitamin D supplement to lower your risk for osteoporosis.  Menopause may have certain physical symptoms and risks.  Hormone replacement therapy may reduce some of these symptoms and risks. Talk to your health care provider about whether hormone replacement therapy is right for you. Follow these instructions at home:  Schedule regular health, dental, and eye exams.  Stay current with your immunizations.  Do not use any tobacco products including cigarettes, chewing tobacco, or electronic cigarettes.  If you are pregnant, do not drink alcohol.  If you are breastfeeding, limit how much and how often you drink alcohol.  Limit alcohol intake to no more than 1 drink per day for nonpregnant women. One drink equals 12 ounces of beer, 5 ounces of wine, or 1 ounces of hard liquor.  Do not use street  drugs.  Do not share needles.  Ask your health care provider for help if you need support or information about quitting drugs.  Tell your health care provider if you often feel depressed.  Tell your health care provider if you have ever been abused or do not feel safe at home. This information is not intended to replace advice given to you by your health care provider. Make sure you discuss any questions you have with your health care provider. Document Released: 05/28/2011 Document Revised: 04/19/2016 Document Reviewed: 08/16/2015 Elsevier Interactive Patient Education  [employer].

## 2018-10-01 LAB — URINALYSIS, COMPLETE W/RFL CULTURE
BILIRUBIN URINE: NEGATIVE
Bacteria, UA: NONE SEEN /HPF
GLUCOSE, UA: NEGATIVE
HYALINE CAST: NONE SEEN /LPF
Hgb urine dipstick: NEGATIVE
Ketones, ur: NEGATIVE
Leukocyte Esterase: NEGATIVE
NITRITES URINE, INITIAL: NEGATIVE
PH: 7.5 (ref 5.0–8.0)
PROTEIN: NEGATIVE
Specific Gravity, Urine: 1.016 (ref 1.001–1.03)
Squamous Epithelial / LPF: NONE SEEN /HPF (ref <=5)

## 2018-10-01 LAB — URINE CULTURE
MICRO NUMBER: 91329741
RESULT: NO GROWTH
SPECIMEN QUALITY:: ADEQUATE

## 2018-10-01 LAB — CULTURE INDICATED

## 2018-10-02 ENCOUNTER — Other Ambulatory Visit: Payer: Self-pay | Admitting: Women's Health

## 2018-10-02 DIAGNOSIS — Z1231 Encounter for screening mammogram for malignant neoplasm of breast: Secondary | ICD-10-CM

## 2018-11-05 ENCOUNTER — Other Ambulatory Visit: Payer: 59

## 2018-11-06 LAB — CBC WITH DIFFERENTIAL/PLATELET
BASOS ABS: 30 {cells}/uL (ref 0–200)
BASOS PCT: 0.7 %
EOS ABS: 138 {cells}/uL (ref 15–500)
EOS PCT: 3.2 %
HCT: 39.3 % (ref 35.0–45.0)
Hemoglobin: 13.1 g/dL (ref 11.7–15.5)
Lymphs Abs: 1939 cells/uL (ref 850–3900)
MCH: 31 pg (ref 27.0–33.0)
MCHC: 33.3 g/dL (ref 32.0–36.0)
MCV: 93.1 fL (ref 80.0–100.0)
MPV: 11 fL (ref 7.5–12.5)
Monocytes Relative: 8.6 %
NEUTROS ABS: 1823 {cells}/uL (ref 1500–7800)
NEUTROS PCT: 42.4 %
PLATELETS: 274 10*3/uL (ref 140–400)
RBC: 4.22 10*6/uL (ref 3.80–5.10)
RDW: 13 % (ref 11.0–15.0)
Total Lymphocyte: 45.1 %
WBC: 4.3 10*3/uL (ref 3.8–10.8)
WBCMIX: 370 {cells}/uL (ref 200–950)

## 2018-11-06 LAB — COMPREHENSIVE METABOLIC PANEL
AG Ratio: 1.3 (calc) (ref 1.0–2.5)
ALT: 14 U/L (ref 6–29)
AST: 20 U/L (ref 10–35)
Albumin: 3.6 g/dL (ref 3.6–5.1)
Alkaline phosphatase (APISO): 51 U/L (ref 33–130)
BUN: 13 mg/dL (ref 7–25)
CO2: 28 mmol/L (ref 20–32)
Calcium: 9 mg/dL (ref 8.6–10.4)
Chloride: 106 mmol/L (ref 98–110)
Creat: 0.73 mg/dL (ref 0.50–1.05)
GLOBULIN: 2.7 g/dL (ref 1.9–3.7)
Glucose, Bld: 92 mg/dL (ref 65–99)
POTASSIUM: 4.4 mmol/L (ref 3.5–5.3)
Sodium: 139 mmol/L (ref 135–146)
TOTAL PROTEIN: 6.3 g/dL (ref 6.1–8.1)
Total Bilirubin: 0.4 mg/dL (ref 0.2–1.2)

## 2018-11-11 ENCOUNTER — Ambulatory Visit
Admission: RE | Admit: 2018-11-11 | Discharge: 2018-11-11 | Disposition: A | Discharge status: Home / Self Care | Payer: 59 | Source: Ambulatory Visit | Attending: Women's Health | Admitting: Women's Health

## 2018-11-11 DIAGNOSIS — Z1231 Encounter for screening mammogram for malignant neoplasm of breast: Secondary | ICD-10-CM

## 2019-10-05 ENCOUNTER — Other Ambulatory Visit: Payer: Self-pay

## 2019-10-06 ENCOUNTER — Ambulatory Visit (INDEPENDENT_AMBULATORY_CARE_PROVIDER_SITE_OTHER): Payer: 59 | Admitting: Women's Health

## 2019-10-06 ENCOUNTER — Encounter: Payer: Self-pay | Admitting: Women's Health

## 2019-10-06 VITALS — BP 118/78 | Ht 60.0 in | Wt 173.0 lb

## 2019-10-06 DIAGNOSIS — Z13220 Encounter for screening for lipoid disorders: Secondary | ICD-10-CM | POA: Diagnosis not present

## 2019-10-06 DIAGNOSIS — Z01419 Encounter for gynecological examination (general) (routine) without abnormal findings: Secondary | ICD-10-CM

## 2019-10-06 NOTE — Progress Notes (Signed)
Ipek Westra 09/05/1966 [id number]    History:    Presents for annual exam.  Interpreter present.  2007 TAH for adenomyosis with relief of pelvic pain.  Has started having occasional hot flushes this past year, manageable.  Normal Pap and mammogram history.  Has not had a screening colonoscopy.  Plans to get a flu shot at the pharmacy.  Not sexually active in years, denies need for STD screen.  Past medical history, past surgical history, family history and social history were all reviewed and documented in the EPIC chart.  Works in a factory.  2 children ages 30 and 59, 77-year-old grandchild.  All doing well.  Mother diabetes  ROS:  A ROS was performed and pertinent positives and negatives are included.  Exam:  Vitals:   10/06/19 1105  BP: 118/78  Weight: 173 lb (78.5 kg)  Height: 5' (1.524 m)   Body mass index is 33.79 kg/m.   General appearance:  Normal Thyroid:  Symmetrical, normal in size, without palpable masses or nodularity. Respiratory  Auscultation:  Clear without wheezing or rhonchi Cardiovascular  Auscultation:  Regular rate, without rubs, murmurs or gallops  Edema/varicosities:  Not grossly evident Abdominal  Soft,nontender, without masses, guarding or rebound.  Liver/spleen:  No organomegaly noted  Hernia:  None appreciated  Skin  Inspection:  Grossly normal   Breasts: Examined lying and sitting.     Right: Without masses, retractions, discharge or axillary adenopathy.     Left: Without masses, retractions, discharge or axillary adenopathy. Gentitourinary   Inguinal/mons:  Normal without inguinal adenopathy  External genitalia:  Normal  BUS/Urethra/Skene's glands:  Normal  Vagina: +1 cystocele asymptomatic   Cervix: And uterus absent   Adnexa/parametria:     Rt: Without masses or tenderness.   Lt: Without masses or tenderness.  Anus and perineum: Normal  Digital rectal exam: Normal sphincter tone without palpated masses or  tenderness  Assessment/Plan:  53 y.o. SHF G4, P2 for annual exam with no complaints.  2007 TAH for adenomyosis good relief of pelvic pain Mild menopausal symptoms/hot flushes Obesity  Plan: Menopause discussed, tolerating hot flushes well okay sleep.  SBEs, continue annual screening mammogram due in December.  Screening colonoscopy discussed encouraged to schedule, [facility] information given.  Flu vaccine discussed and encouraged.  Self-care, leisure activities, weightbearing and balance type exercise encouraged.  Calcium rich foods, vitamin D 2000 daily encouraged.  Aware of need to decrease calories/carbs.  CBC, CMP, lipid panel,    Huel Cote [facility], 11:58 AM 10/06/2019

## 2019-10-06 NOTE — Patient Instructions (Signed)
Vit D3 2000 iu daily  Colonoscopy  [facility] [phone]  Mantenimiento de la salud de las mujeres posmenopusicas Health Maintenance for Postmenopausal Women La menopausia es un proceso normal en el cual la capacidad de quedar embarazada llega a su fin. Este proceso ocurre lentamente a lo largo de un perodo de muchos meses o aos; por lo general, entre los 48 y los 55aos. La menopausia es completa cuando no se ha tenido el perodo menstrual por . Es importante hablar con el mdico sobre algunas de las enfermedades ms comunes que afectan a las mujeres despus de la menopausia (mujeres posmenopusicas). Estas incluyen la enfermedad cardaca, el cncer y la prdida sea (osteoporosis). Adoptar un estilo de vida saludable y recibir atencin preventiva pueden ayudar a promover la salud y Counsellor. Las medidas que tome tambin pueden reducir las probabilidades de Environmental education officer algunas de estas enfermedades frecuentes. Qu debo saber acerca de la menopausia? Durante la menopausia, puede tener una serie de sntomas, por ejemplo:  Acaloramiento. Estos pueden ser moderados o intensos.  Sudoracin nocturna.  Disminucin del deseo sexual.  Cambios en el estado de nimo.  Dolores de [place].  Cansancio.  Irritabilidad.  Problemas de memoria.  Insomnio. Tratar o no estos sntomas es una decisin que se toma con el mdico. Necesito terapia de reemplazo hormonal?  La terapia de reemplazo hormonal es eficaz para tratar los sntomas causados por la menopausia, como los acaloramientos y las sudoraciones nocturnas.  La reposicin hormonal conlleva ciertos riesgos, especialmente a medida que una mujer envejece. Si est pensando en usar estrgeno o estrgeno con progestina, analice los beneficios y los riesgos con el mdico. Cul es mi riesgo de sufrir enfermedad cardaca y accidente cerebrovascular? A medida que se envejece, aumenta el riesgo de enfermedad cardaca, infarto de miocardio  y accidente cerebrovascular. Una de las causas puede ser un cambio en las hormonas del cuerpo durante la menopausia. Esto puede afectar la forma en que el organismo procesa las [place], los triglicridos y el colesterol de su dieta. El infarto de miocardio y el accidente cerebrovascular son emergencias mdicas. Hay muchas cosas que se pueden hacer para ayudar a prevenir la enfermedad cardaca y el accidente cerebrovascular. Contrlese la presin arterial  La hipertensin arterial causa enfermedades cardacas y [place] el riesgo de accidente cerebrovascular. Es ms probable que esto se manifieste en las personas que tienen lecturas de presin arterial alta, tienen ascendencia africana o tienen sobrepeso.  Hgase controlar la presin arterial: ? Cada 3 a 5 aos si tiene entre 18 y 74 aos. ? Todos los aos si es mayor de [place]. Consuma una dieta saludable   Consuma una dieta que incluya muchas verduras, frutas, productos lcteos con bajo contenido de [place] y Associate Professor.  No consuma muchos alimentos ricos en grasas slidas, azcares agregados o sodio. Haga ejercicio con regularidad Haga ejercicio con regularidad. Esta es una de las prcticas ms importantes que puede hacer por su salud. La mayora de los adultos deben seguir estas pautas:  Intente realizar al menos de actividad fsica por semana. El ejercicio debe aumentar la frecuencia cardaca y Media planner transpirar (ejercicio de intensidad moderada).  Intente hacer ejercicios de elongacin por lo menos dos veces por semana. Agrguelos al plan de ejercicio de intensidad moderada.  Pasar menos tiempo sentados. Incluso la actividad fsica ligera puede ser beneficiosa. Otros consejos  Trabaje con su mdico para Barista o Pharmacologist un peso saludable.  No consuma ningn producto que contenga nicotina o tabaco, como cigarrillos, cigarrillos electrnicos  y tabaco de Higher education careers adviser. Si necesita ayuda para dejar de fumar, consulte al mdico.   Conozca sus cifras. Pdale al mdico que le controle el colesterol y el nivel sanguneo de azcar en la sangre (glucosa). Siga hacindose anlisis de [employer] se lo haya indicado el mdico. Necesito realizarme pruebas de deteccin del cncer? Segn su historia clnica y sus antecedentes familiares, es posible que deba realizarse pruebas de deteccin del cncer en diferentes etapas de la vida. Esto puede incluir pruebas de deteccin de lo siguiente:  Cncer de mama.  Cncer de cuello uterino.  Cncer de pulmn.  Cncer colorrectal. Cul es mi riesgo de tener osteoporosis? Despus de la menopausia, puede correr un riesgo ms alto de tener osteoporosis. La osteoporosis es una afeccin en la cual la destruccin de la masa sea ocurre con mayor rapidez que su formacin. Para ayudar a prevenir esta afeccin o las fracturas seas que pueden ocurrir a causa de [place], usted puede tomar las siguientes medidas:  Si tiene entre 19 y 50aos, tome como mnimo 1000mg  de calcio y 600mg  de vitaminaD por Training and development officer.  Si es mayor de 50aos pero menor de 70aos, tome como mnimo 1200mg  de calcio y 600mg  de vitaminaD por Training and development officer.  Si es mayor de 70aos, tome como mnimo 1200mg  de calcio y 800mg  de vitaminaD por Training and development officer. Fumar y beber alcohol en exceso aumentan el riesgo de osteoporosis. Consuma alimentos ricos en calcio y vitaminaD, y haga ejercicios con soporte de peso varias veces a la semana, como se lo haya indicado el mdico. De qu manera la menopausia afecta mi salud mental? La depresin puede presentarse a cualquier edad, pero es ms frecuente a medida que una persona envejece. Los sntomas comunes de depresin incluyen lo siguiente:  Desnimo o tristeza.  Cambios en los patrones de sueo.  Cambios en el apetito o en los hbitos de alimentacin.  Sensacin de falta general de motivacin o placer al [employer] actividades que sola disfrutar.  Crisis frecuentes de llanto. Hable con el  mdico si cree que tiene depresin. Instrucciones generales Visite a su mdico para hacerse exmenes de bienestar peridicos y aplicarse vacunas. Puede incluir:  Programar exmenes peridicos dentales, de la salud y de Public librarian.  Recibir y [employer]. Estos incluyen los siguientes: ? Human resources officer. Aplquese esta vacuna todos los aos antes de que comience la temporada de gripe. ? Vacuna contra la neumona. ? Vacuna contra el herpes. ? Vacuna contra el ttanos, la difteria y la tos Dyann Ruddle (Tdap). El mdico tambin puede recomendarle que se aplique otras vacunas. Notifique a su mdico si alguna vez ha sido vctima de abuso o si no se siente seguro en su hogar. Resumen  La menopausia es un proceso normal en el cual la capacidad de quedar embarazada llega a su fin.  Esta condicin causa acaloramientos, sudoraciones nocturnas, disminucin del inters en el sexo, cambios en el estado de nimo, dolores de [place] o falta de sueo.  El tratamiento de esta afeccin puede incluir una terapia de reemplazo hormonal.  Tome medidas para mantenerse 31, entre ellas, hacer ejercicio con regularidad, seguir una dieta saludable, controlar su peso y medirse la presin arterial y los niveles de Dispensing optician.  Hgase pruebas para Film/video editor y depresin. Asegrese de estar al da con todas las vacunas. Esta informacin no tiene Marine scientist el consejo del mdico. Asegrese de hacerle al mdico cualquier pregunta que tenga. Document Released: 09/02/2013 Document Revised: 12/03/2018 Document Reviewed: 12/03/2018  Elsevier Patient Education  [employer].

## 2019-10-07 LAB — LIPID PANEL
Cholesterol: 189 mg/dL (ref <200)
HDL: 48 mg/dL — ABNORMAL LOW (ref >=50)
LDL Cholesterol (Calc): 121 mg/dL (calc) — ABNORMAL HIGH
Non-HDL Cholesterol (Calc): 141 mg/dL (calc) — ABNORMAL HIGH (ref <130)
Total CHOL/HDL Ratio: 3.9 (calc) (ref <5.0)
Triglycerides: 101 mg/dL (ref <150)

## 2019-10-07 LAB — CBC WITH DIFFERENTIAL/PLATELET
Absolute Monocytes: 380 cells/uL (ref 200–950)
Basophils Absolute: 42 cells/uL (ref 0–200)
Basophils Relative: 0.8 %
Eosinophils Absolute: 182 cells/uL (ref 15–500)
Eosinophils Relative: 3.5 %
HCT: 42.1 % (ref 35.0–45.0)
Hemoglobin: 13.7 g/dL (ref 11.7–15.5)
Lymphs Abs: 2397 cells/uL (ref 850–3900)
MCH: 29.8 pg (ref 27.0–33.0)
MCHC: 32.5 g/dL (ref 32.0–36.0)
MCV: 91.5 fL (ref 80.0–100.0)
MPV: 11.6 fL (ref 7.5–12.5)
Monocytes Relative: 7.3 %
Neutro Abs: 2200 cells/uL (ref 1500–7800)
Neutrophils Relative %: 42.3 %
Platelets: 264 10*3/uL (ref 140–400)
RBC: 4.6 10*6/uL (ref 3.80–5.10)
RDW: 13.2 % (ref 11.0–15.0)
Total Lymphocyte: 46.1 %
WBC: 5.2 10*3/uL (ref 3.8–10.8)

## 2019-10-07 LAB — COMPREHENSIVE METABOLIC PANEL
AG Ratio: 1.6 (calc) (ref 1.0–2.5)
ALT: 14 U/L (ref 6–29)
AST: 21 U/L (ref 10–35)
Albumin: 4.2 g/dL (ref 3.6–5.1)
Alkaline phosphatase (APISO): 64 U/L (ref 37–153)
BUN: 13 mg/dL (ref 7–25)
CO2: 30 mmol/L (ref 20–32)
Calcium: 10.1 mg/dL (ref 8.6–10.4)
Chloride: 104 mmol/L (ref 98–110)
Creat: 0.73 mg/dL (ref 0.50–1.05)
Globulin: 2.6 g/dL (calc) (ref 1.9–3.7)
Glucose, Bld: 101 mg/dL — ABNORMAL HIGH (ref 65–99)
Potassium: 4.6 mmol/L (ref 3.5–5.3)
Sodium: 139 mmol/L (ref 135–146)
Total Bilirubin: 0.4 mg/dL (ref 0.2–1.2)
Total Protein: 6.8 g/dL (ref 6.1–8.1)

## 2019-10-08 LAB — URINALYSIS, COMPLETE W/RFL CULTURE
Bacteria, UA: NONE SEEN /HPF
Bilirubin Urine: NEGATIVE
Glucose, UA: NEGATIVE
Hgb urine dipstick: NEGATIVE
Hyaline Cast: NONE SEEN /LPF
Ketones, ur: NEGATIVE
Leukocyte Esterase: NEGATIVE
Nitrites, Initial: NEGATIVE
Protein, ur: NEGATIVE
Specific Gravity, Urine: 1.017 (ref 1.001–1.03)
pH: >=8.5 — AB (ref 5.0–8.0)

## 2019-10-08 LAB — CULTURE INDICATED

## 2019-10-08 LAB — URINE CULTURE
MICRO NUMBER:: 1087403
Result:: NO GROWTH
SPECIMEN QUALITY:: ADEQUATE

## 2019-10-12 ENCOUNTER — Other Ambulatory Visit: Payer: Self-pay | Admitting: Women's Health

## 2019-10-12 DIAGNOSIS — Z1231 Encounter for screening mammogram for malignant neoplasm of breast: Secondary | ICD-10-CM

## 2019-12-04 ENCOUNTER — Ambulatory Visit
Admission: RE | Admit: 2019-12-04 | Discharge: 2019-12-04 | Disposition: A | Discharge status: Home / Self Care | Payer: 59 | Source: Ambulatory Visit | Attending: Women's Health | Admitting: Women's Health

## 2019-12-04 ENCOUNTER — Other Ambulatory Visit: Payer: Self-pay

## 2019-12-04 DIAGNOSIS — Z1231 Encounter for screening mammogram for malignant neoplasm of breast: Secondary | ICD-10-CM

## 2020-10-06 ENCOUNTER — Ambulatory Visit (INDEPENDENT_AMBULATORY_CARE_PROVIDER_SITE_OTHER): Payer: 59 | Admitting: Nurse Practitioner

## 2020-10-06 ENCOUNTER — Encounter: Payer: Self-pay | Admitting: Nurse Practitioner

## 2020-10-06 ENCOUNTER — Other Ambulatory Visit: Payer: Self-pay

## 2020-10-06 VITALS — BP 118/80 | Ht 60.0 in | Wt 165.0 lb

## 2020-10-06 DIAGNOSIS — J392 Other diseases of pharynx: Secondary | ICD-10-CM

## 2020-10-06 DIAGNOSIS — Z01419 Encounter for gynecological examination (general) (routine) without abnormal findings: Secondary | ICD-10-CM

## 2020-10-06 DIAGNOSIS — Z13220 Encounter for screening for lipoid disorders: Secondary | ICD-10-CM

## 2020-10-06 DIAGNOSIS — Z90710 Acquired absence of both cervix and uterus: Secondary | ICD-10-CM

## 2020-10-06 NOTE — Progress Notes (Signed)
   Elizabeth Hernandez [birth year] [id number]   History:  54 y.o. [id number] presents for annual exam. 2007 TAH for adenomyosis and chronic pelvic pain. Normal pap and mammogram history. Having some menopausal symptoms but says they have improved. Complains of a dry throat for years that feels inflamed at times. Denies cough or burning. Does have congestion at times and burping.   Gynecologic History No LMP recorded. Patient has had a hysterectomy.   Contraception: status post hysterectomy Last Pap: 2012. Results were: normal Last mammogram: 11/2019. Results were: normal Last colonoscopy: Never Last Dexa: Never  Past medical history, past surgical history, family history and social history were all reviewed and documented in the EPIC chart.  ROS:  A ROS was performed and pertinent positives and negatives are included.  Exam:  Vitals:   10/06/20 1022  BP: 118/80  Weight: 165 lb (74.8 kg)  Height: 5' (1.524 m)   Body mass index is 32.22 kg/m.  General appearance:  Normal Thyroid:  Symmetrical, normal in size, without palpable masses or nodularity. Respiratory  Auscultation:  Clear without wheezing or rhonchi Cardiovascular  Auscultation:  Regular rate, without rubs, murmurs or gallops  Edema/varicosities:  Not grossly evident Abdominal  Soft,nontender, without masses, guarding or rebound.  Liver/spleen:  No organomegaly noted  Hernia:  None appreciated  Skin  Inspection:  Grossly normal   Breasts: Examined lying and sitting.   Right: Without masses, retractions, discharge or axillary adenopathy.   Left: Without masses, retractions, discharge or axillary adenopathy. Gentitourinary   Inguinal/mons:  Normal without inguinal adenopathy  External genitalia:  Normal  BUS/Urethra/Skene's glands:  Normal  Vagina:  Normal  Cervix:  Absent  Uterus:  Absent  Adnexa/parametria:     Rt: Without masses or tenderness.   Lt: Without masses or tenderness.  Anus and  perineum: Normal  Digital rectal exam: Normal sphincter tone without palpated masses or tenderness  Assessment/Plan:  54 y.o. [id number] for annual exam.   Well female exam with routine gynecological exam - Plan: CBC with Differential/Platelet, Comprehensive metabolic panel. Education provided on SBEs, importance of preventative screenings, current guidelines, high calcium diet, regular exercise, and multivitamin daily.   History of total abdominal hysterectomy - 2007 for adenomyosis/chronic pelvic pain.   Lipid screening - Plan: Lipid panel  Dry throat - Most likely related to allergies or acid reflux. Recommend trying OTC Claritin and a nasal spray. Also recommend weight loss and avoiding acidic/spicy/greasy foods to see if this helps.  Screening for cervical cancer - Normal pap history. Will repeat pap next year at 10-year interval.   Screening for breast cancer - Normal mammogram history. Continue annual screenings. Normal breast exam today.   Screening for colon cancer - Has not had screening colonoscopy. Discussed current guidelines and importance of preventative screenings. Will refer to GI.   Follow up in 1 year for annual.       Elizabeth Hernandez, 10:39 AM 10/06/2020

## 2020-10-06 NOTE — Patient Instructions (Addendum)
Mantenimiento de la salud despus de los 65 aos de edad Health Maintenance After Age 53 Despus de los 65 aos de edad, corre un riesgo mayor de padecer ciertas enfermedades e infecciones a largo plazo, como tambin de sufrir lesiones por cadas. Las cadas son la causa principal de las fracturas de huesos y lesiones en la cabeza de personas mayores de 65 aos de edad. Recibir cuidados preventivos de forma regular puede ayudarlo a mantenerse saludable y en buen estado. Los cuidados preventivos incluyen realizarse anlisis de forma regular y realizar cambios en el estilo de vida segn las recomendaciones del mdico. Converse con el profesional que lo asiste sobre:  Las pruebas de deteccin y los anlisis que debe realizarse. Una prueba de deteccin es un estudio que se para detectar la presencia de una enfermedad cuando no tiene sntomas.  Un plan de dieta y ejercicios adecuado para usted. Qu debo saber sobre las pruebas de deteccin y los anlisis para prevenir cadas? Realizarse pruebas de deteccin y anlisis es la mejor manera de detectar un problema de salud de forma temprana. El diagnstico y tratamiento tempranos le brindan la mejor oportunidad de controlar las afecciones mdicas que son comunes despus de los 65 aos de edad. Ciertas afecciones y elecciones de estilo de vida pueden hacer que sea ms propenso a sufrir una cada. El mdico puede recomendarle lo siguiente:  Controles regulares de la visin. Una visin deficiente y afecciones como las cataratas pueden hacer que sea ms propenso a sufrir una cada. Si usa lentes, asegrese de obtener una receta actualizada si su visin cambia.  Revisin de medicamentos. Revise regularmente con el mdico todos los medicamentos que toma, incluidos los medicamentos de venta libre. Consulte al mdico sobre los efectos secundarios que pueden hacer que sea ms propenso a sufrir una cada. Informe al mdico si alguno de los medicamentos que toma lo hace  sentir mareado o somnoliento.  Pruebas de deteccin para la osteoporosis. La osteoporosis es una afeccin que hace que los huesos se vuelvan ms frgiles. En consecuencia, los huesos pueden debilitarse y quebrarse ms fcilmente.  Pruebas de deteccin para la presin arterial. Los cambios en la presin arterial y los medicamentos para controlar la presin arterial pueden hacerlo sentir mareado.  Controles de fuerza y equilibrio. El mdico puede recomendar ciertos estudios para controlar su fuerza y equilibrio al estar de pie, al caminar o al cambiar de posicin.  Examen de los pies. El dolor y el adormecimiento en los pies, como tambin no utilizar el calzado adecuado, pueden hacer que sea ms propenso a sufrir una cada.  Prueba de deteccin de la depresin. Es ms probable que sufra una cada si tiene miedo a caerse, se siente mal emocionalmente o se siente incapaz de realizar actividades que sola hacer.  Prueba de deteccin de consumo de alcohol. Beber demasiado alcohol puede afectar su equilibrio y puede hacer que sea ms propenso a sufrir una cada. Qu medidas puedo tomar para reducir mi riesgo de sufrir una cada? Instrucciones generales  Hable con el mdico sobre sus riesgos de sufrir una cada. Infrmele a su mdico si: ? Se cae. Asegrese de informarle a su mdico acerca de todas las cadas, incluso aquellas que parecen ser menores. ? Se siente mareado, somnoliento o que pierde el equilibrio.  Tome los medicamentos de venta libre y los recetados solamente como se lo haya indicado el mdico. Estos incluyen todos los suplementos.  Siga una dieta sana y mantenga un peso saludable. Una dieta saludable incluye   productos lcteos descremados, carnes bajas en contenido de grasa (magras, fibra de granos enteros, frijoles y muchas frutas y verduras. La seguridad en el hogar  Retire los objetos que puedan causar tropiezos tales como alfombras, cables u obstculos.  Instale equipos de  seguridad, como barras para sostn en los baos y barandas de seguridad en las escaleras.  Mantenga las habitaciones y los pasillos bien iluminados. Actividad   Siga un programa de ejercicio regular para mantenerse en forma. Esto lo ayudar a mantener el equilibrio. Consulte al mdico qu tipos de ejercicios son adecuados para usted.  Si necesita un bastn o un andador, selo segn las recomendaciones del mdico.  Utilice calzado con buen apoyo y suela antideslizante. Estilo de vida  No beba alcohol si el mdico le indica que no beba.  Si bebe alcohol, limite la cantidad que consume: ? De 0 a 1 medida por da para las mujeres. ? De 0 a 2 medidas por da para los hombres.  Est atento a la cantidad de alcohol que contiene su bebida. [place] los [place] una medida equivale a una botella tpica de cerveza (12 onzas), media copa de vino (5 onzas) o una medida de bebida blanca (1 onza).  No consuma ningn producto que contenga nicotina o tabaco, como cigarrillos y cigarrillos electrnicos. Si necesita ayuda para dejar de fumar, consulte al mdico. Resumen  Tener un estilo de vida saludable y recibir cuidados preventivos pueden ayudar a promover la salud y el bienestar despus de los 65 aos de edad.  Realizarse pruebas de deteccin y anlisis es la mejor manera de detectar un problema de salud de forma temprana y ayudarlo a evitar una cada. El diagnstico y tratamiento tempranos le brindan la mejor oportunidad de controlar las afecciones mdicas ms comunes en las personas mayores de 65 aos de edad.  Las cadas son la causa principal de las fracturas de huesos y lesiones en la cabeza de personas mayores de 65 aos de edad. Tome precauciones para evitar una cada en su casa.  Trabaje con el mdico para saber qu cambios que puede hacer para mejorar su salud y bienestar, y para prevenir las cadas. Esta informacin no tiene como fin reemplazar el consejo del mdico. Asegrese de hacerle al  mdico cualquier pregunta que tenga. Document Revised: 12/26/2017 Document Reviewed: 12/26/2017 Elsevier Patient Education  2020 Elsevier Inc.  

## 2020-10-07 ENCOUNTER — Other Ambulatory Visit: Payer: Self-pay

## 2020-10-07 ENCOUNTER — Other Ambulatory Visit: Payer: Self-pay | Admitting: Nurse Practitioner

## 2020-10-07 ENCOUNTER — Telehealth: Payer: Self-pay | Admitting: *Deleted

## 2020-10-07 DIAGNOSIS — Z1211 Encounter for screening for malignant neoplasm of colon: Secondary | ICD-10-CM

## 2020-10-07 DIAGNOSIS — D72818 Other decreased white blood cell count: Secondary | ICD-10-CM

## 2020-10-07 DIAGNOSIS — D709 Neutropenia, unspecified: Secondary | ICD-10-CM

## 2020-10-07 DIAGNOSIS — D708 Other neutropenia: Secondary | ICD-10-CM

## 2020-10-07 LAB — COMPREHENSIVE METABOLIC PANEL
AG Ratio: 1.6 (calc) (ref 1.0–2.5)
ALT: 15 U/L (ref 6–29)
AST: 24 U/L (ref 10–35)
Albumin: 4 g/dL (ref 3.6–5.1)
Alkaline phosphatase (APISO): 60 U/L (ref 37–153)
BUN: 14 mg/dL (ref 7–25)
CO2: 28 mmol/L (ref 20–32)
Calcium: 9.3 mg/dL (ref 8.6–10.4)
Chloride: 104 mmol/L (ref 98–110)
Creat: 0.74 mg/dL (ref 0.50–1.05)
Globulin: 2.5 g/dL (calc) (ref 1.9–3.7)
Glucose, Bld: 93 mg/dL (ref 65–99)
Potassium: 4.1 mmol/L (ref 3.5–5.3)
Sodium: 139 mmol/L (ref 135–146)
Total Bilirubin: 0.2 mg/dL (ref 0.2–1.2)
Total Protein: 6.5 g/dL (ref 6.1–8.1)

## 2020-10-07 LAB — CBC WITH DIFFERENTIAL/PLATELET
Absolute Monocytes: 349 cells/uL (ref 200–950)
Basophils Absolute: 19 cells/uL (ref 0–200)
Basophils Relative: 0.6 %
Eosinophils Absolute: 141 cells/uL (ref 15–500)
Eosinophils Relative: 4.4 %
HCT: 40.4 % (ref 35.0–45.0)
Hemoglobin: 13.4 g/dL (ref 11.7–15.5)
Lymphs Abs: 1491 cells/uL (ref 850–3900)
MCH: 29.7 pg (ref 27.0–33.0)
MCHC: 33.2 g/dL (ref 32.0–36.0)
MCV: 89.6 fL (ref 80.0–100.0)
MPV: 11.2 fL (ref 7.5–12.5)
Monocytes Relative: 10.9 %
Neutro Abs: 1200 cells/uL — ABNORMAL LOW (ref 1500–7800)
Neutrophils Relative %: 37.5 %
Platelets: 255 10*3/uL (ref 140–400)
RBC: 4.51 10*6/uL (ref 3.80–5.10)
RDW: 13.7 % (ref 11.0–15.0)
Total Lymphocyte: 46.6 %
WBC: 3.2 10*3/uL — ABNORMAL LOW (ref 3.8–10.8)

## 2020-10-07 LAB — LIPID PANEL
Cholesterol: 137 mg/dL (ref <200)
HDL: 43 mg/dL — ABNORMAL LOW (ref >=50)
LDL Cholesterol (Calc): 80 mg/dL (calc)
Non-HDL Cholesterol (Calc): 94 mg/dL (calc) (ref <130)
Total CHOL/HDL Ratio: 3.2 (calc) (ref <5.0)
Triglycerides: 67 mg/dL (ref <150)

## 2020-10-07 NOTE — Telephone Encounter (Signed)
-----   Message from [name], NP sent at 10/06/2020 10:57 AM EST ----- Please send referral for screening colonoscopy. #Spanish speaking#

## 2020-10-07 NOTE — Telephone Encounter (Signed)
Referral placed at [facility] for the below,they will call patient to schedule.

## 2020-11-01 ENCOUNTER — Other Ambulatory Visit: Payer: Self-pay | Admitting: *Deleted

## 2020-11-01 DIAGNOSIS — Z1231 Encounter for screening mammogram for malignant neoplasm of breast: Secondary | ICD-10-CM

## 2020-11-08 ENCOUNTER — Other Ambulatory Visit: Payer: Self-pay

## 2020-11-08 ENCOUNTER — Other Ambulatory Visit: Payer: 59

## 2020-11-08 DIAGNOSIS — D709 Neutropenia, unspecified: Secondary | ICD-10-CM

## 2020-11-08 LAB — CBC WITH DIFFERENTIAL/PLATELET
Absolute Monocytes: 270 cells/uL (ref 200–950)
Basophils Absolute: 18 cells/uL (ref 0–200)
Basophils Relative: 0.4 %
Eosinophils Absolute: 189 cells/uL (ref 15–500)
Eosinophils Relative: 4.2 %
HCT: 41.1 % (ref 35.0–45.0)
Hemoglobin: 13.4 g/dL (ref 11.7–15.5)
Lymphs Abs: 2237 cells/uL (ref 850–3900)
MCH: 29.1 pg (ref 27.0–33.0)
MCHC: 32.6 g/dL (ref 32.0–36.0)
MCV: 89.2 fL (ref 80.0–100.0)
MPV: 11.4 fL (ref 7.5–12.5)
Monocytes Relative: 6 %
Neutro Abs: 1787 cells/uL (ref 1500–7800)
Neutrophils Relative %: 39.7 %
Platelets: 274 10*3/uL (ref 140–400)
RBC: 4.61 10*6/uL (ref 3.80–5.10)
RDW: 13.3 % (ref 11.0–15.0)
Total Lymphocyte: 49.7 %
WBC: 4.5 10*3/uL (ref 3.8–10.8)

## 2020-11-10 ENCOUNTER — Telehealth: Payer: Self-pay | Admitting: Anesthesiology

## 2020-11-10 NOTE — Telephone Encounter (Signed)
Called patient and informed of message below. Information provided in Spanish.    Please let Elizabeth Hernandez know her blood work came back normal this time and she does not need to return for any more lab work. Thank you!

## 2020-12-16 ENCOUNTER — Other Ambulatory Visit: Payer: Self-pay

## 2020-12-16 ENCOUNTER — Ambulatory Visit
Admission: RE | Admit: 2020-12-16 | Discharge: 2020-12-16 | Disposition: A | Discharge status: Home / Self Care | Payer: 59 | Source: Ambulatory Visit | Attending: *Deleted | Admitting: *Deleted

## 2020-12-16 DIAGNOSIS — Z1231 Encounter for screening mammogram for malignant neoplasm of breast: Secondary | ICD-10-CM

## 2021-10-09 NOTE — Progress Notes (Signed)
Elizabeth Hernandez [birth year] [id number]   History:  55 y.o. [id number] presents for annual exam. 2007 TAH for adenomyosis and chronic pelvic pain. Normal pap and mammogram history. Complains of long history of throat congestion that is worse at night and requires her to spit. Phlegm is white. Denies burning or coughing. We discussed likelihood of allergies or acid reflux last year with recommendations to take OTC allergy pill and a nasal spray, as well as working on weight loss/avoiding acidic foods. She has not tried these recommendations. Interpreter present during visit.   Gynecologic History No LMP recorded. Patient has had a hysterectomy.   Contraception: status post hysterectomy  Health maintenance Last Pap: 2012. Results were: Normal Last mammogram: 12/16/2020. Results were: Normal Last colonoscopy: Never Last Dexa: Never  Past medical history, past surgical history, family history and social history were all reviewed and documented in the EPIC chart. Married. Works for [employer]. 2 children, 2 grandchildren ages 1.5 and 66 yo.   ROS:  A ROS was performed and pertinent positives and negatives are included.  Exam:  Vitals:   10/10/21 0945  BP: 124/84  Weight: 171 lb (77.6 kg)  Height: 5' (1.524 m)    Body mass index is 33.4 kg/m.  General appearance:  Normal Thyroid:  Symmetrical, normal in size, without palpable masses or nodularity. Respiratory  Auscultation:  Clear without wheezing or rhonchi Cardiovascular  Auscultation:  Regular rate, without rubs, murmurs or gallops  Edema/varicosities:  Not grossly evident Abdominal  Soft,nontender, without masses, guarding or rebound.  Liver/spleen:  No organomegaly noted  Hernia:  None appreciated  Skin  Inspection:  Grossly normal   Breasts: Examined lying and sitting.   Right: Without masses, retractions, discharge or axillary adenopathy.   Left: Without masses, retractions, discharge or axillary  adenopathy. Genitourinary   Inguinal/mons:  Normal without inguinal adenopathy  External genitalia:  Normal appearing vulva with no masses, tenderness, or lesions  BUS/Urethra/Skene's glands:  Normal  Vagina:  Normal appearing with normal color and discharge, no lesions  Cervix:  Absent  Uterus:  Absent  Adnexa/parametria:     Rt: Normal in size, without masses or tenderness.   Lt: Normal in size, without masses or tenderness.  Anus and perineum: Normal  Digital rectal exam: Normal sphincter tone without palpated masses or tenderness  Patient informed chaperone available to be present for breast and pelvic exam. Patient has requested no chaperone to be present. Patient has been advised what will be completed during breast and pelvic exam.   Assessment/Plan:  55 y.o. [id number] for annual exam.   Well female exam with routine gynecological exam - Plan: CBC with Differential/Platelet, Comprehensive metabolic panel. Education provided on SBEs, importance of preventative screenings, current guidelines, high calcium diet, regular exercise, and multivitamin daily.   Congestion of throat - Plan: loratadine (CLARITIN) 10 MG tablet daily along with a nasal spray. Complains of long history of throat congestion that is worse at night and requires her to spit. Phlegm is white. Denies burning or coughing. We discussed likelihood of allergies or acid reflux last year with recommendations to take OTC allergy pill and a nasal spray, as well as working on weight loss/avoiding acidic foods. She has not tried these recommendations. She is agreeable to try allergy pill now and has been sent to her pharmacy. If symptoms do not improve it is recommended she see GI for evaluation.   Screening for cervical cancer - Normal pap history. No longer screening per guidelines.   Screening  for breast cancer - Normal mammogram history. Continue annual screenings. Normal breast exam today.   Screening for colon cancer - Has  not had screening colonoscopy. Discussed current guidelines and importance of preventative screenings.   Follow up in 1 year for annual.       [name] [facility], 10:26 AM 10/10/2021

## 2021-10-10 ENCOUNTER — Encounter: Payer: Self-pay | Admitting: Nurse Practitioner

## 2021-10-10 ENCOUNTER — Ambulatory Visit (INDEPENDENT_AMBULATORY_CARE_PROVIDER_SITE_OTHER): Payer: 59 | Admitting: Nurse Practitioner

## 2021-10-10 ENCOUNTER — Other Ambulatory Visit: Payer: Self-pay

## 2021-10-10 VITALS — BP 124/84 | Ht 60.0 in | Wt 171.0 lb

## 2021-10-10 DIAGNOSIS — Z01419 Encounter for gynecological examination (general) (routine) without abnormal findings: Secondary | ICD-10-CM

## 2021-10-10 DIAGNOSIS — R6889 Other general symptoms and signs: Secondary | ICD-10-CM

## 2021-10-10 LAB — COMPREHENSIVE METABOLIC PANEL
AG Ratio: 1.5 (calc) (ref 1.0–2.5)
ALT: 16 U/L (ref 6–29)
AST: 20 U/L (ref 10–35)
Albumin: 3.9 g/dL (ref 3.6–5.1)
Alkaline phosphatase (APISO): 69 U/L (ref 37–153)
BUN: 18 mg/dL (ref 7–25)
CO2: 30 mmol/L (ref 20–32)
Calcium: 9.4 mg/dL (ref 8.6–10.4)
Chloride: 106 mmol/L (ref 98–110)
Creat: 0.7 mg/dL (ref 0.50–1.03)
Globulin: 2.6 g/dL (calc) (ref 1.9–3.7)
Glucose, Bld: 93 mg/dL (ref 65–99)
Potassium: 4.1 mmol/L (ref 3.5–5.3)
Sodium: 141 mmol/L (ref 135–146)
Total Bilirubin: 0.4 mg/dL (ref 0.2–1.2)
Total Protein: 6.5 g/dL (ref 6.1–8.1)

## 2021-10-10 LAB — CBC WITH DIFFERENTIAL/PLATELET
Absolute Monocytes: 348 cells/uL (ref 200–950)
Basophils Absolute: 49 cells/uL (ref 0–200)
Basophils Relative: 1 %
Eosinophils Absolute: 176 cells/uL (ref 15–500)
Eosinophils Relative: 3.6 %
HCT: 40.4 % (ref 35.0–45.0)
Hemoglobin: 13 g/dL (ref 11.7–15.5)
Lymphs Abs: 2190 cells/uL (ref 850–3900)
MCH: 29.1 pg (ref 27.0–33.0)
MCHC: 32.2 g/dL (ref 32.0–36.0)
MCV: 90.6 fL (ref 80.0–100.0)
MPV: 11.5 fL (ref 7.5–12.5)
Monocytes Relative: 7.1 %
Neutro Abs: 2136 cells/uL (ref 1500–7800)
Neutrophils Relative %: 43.6 %
Platelets: 270 10*3/uL (ref 140–400)
RBC: 4.46 10*6/uL (ref 3.80–5.10)
RDW: 13.5 % (ref 11.0–15.0)
Total Lymphocyte: 44.7 %
WBC: 4.9 10*3/uL (ref 3.8–10.8)

## 2021-10-10 MED ORDER — LORATADINE 10 MG PO TABS: 10.0000 mg | ORAL_TABLET | Freq: Every day | ORAL | 1 refills | Status: DC

## 2021-10-13 ENCOUNTER — Encounter: Payer: Self-pay | Admitting: Anesthesiology

## 2021-10-24 ENCOUNTER — Encounter: Payer: Self-pay | Admitting: Internal Medicine

## 2021-10-26 ENCOUNTER — Other Ambulatory Visit: Payer: Self-pay | Admitting: Nurse Practitioner

## 2021-10-26 DIAGNOSIS — Z1231 Encounter for screening mammogram for malignant neoplasm of breast: Secondary | ICD-10-CM

## 2021-12-18 ENCOUNTER — Ambulatory Visit
Admission: RE | Admit: 2021-12-18 | Discharge: 2021-12-18 | Disposition: A | Discharge status: Home / Self Care | Payer: 59 | Source: Ambulatory Visit | Attending: Nurse Practitioner | Admitting: Nurse Practitioner

## 2021-12-18 DIAGNOSIS — Z1231 Encounter for screening mammogram for malignant neoplasm of breast: Secondary | ICD-10-CM

## 2021-12-22 ENCOUNTER — Other Ambulatory Visit: Payer: Self-pay

## 2021-12-22 ENCOUNTER — Ambulatory Visit (AMBULATORY_SURGERY_CENTER): Payer: Self-pay

## 2021-12-22 VITALS — Ht 60.0 in | Wt 179.4 lb

## 2021-12-22 DIAGNOSIS — Z1211 Encounter for screening for malignant neoplasm of colon: Secondary | ICD-10-CM

## 2021-12-22 NOTE — Progress Notes (Signed)
Spanish interpreter present for PV appt=Sonia H.; No egg or soy allergy known to patient  No issues known to pt with past sedation with any surgeries or procedures Patient denies ever being told they had issues or difficulty with intubation  No FH of Malignant Hyperthermia Pt is not on diet pills Pt is not on home 02  Pt is not on blood thinners  Pt denies issues with constipation;  No A fib or A flutter NO PA's for preps discussed with pt in PV today  Discussed with pt there will be an out-of-pocket cost for prep and that varies from $0 to 70 + dollars - pt verbalized understanding  Due to the COVID-19 pandemic we are asking patients to follow certain guidelines in PV and the [place]   Pt aware of COVID protocols and [facility] guidelines  Pt verified name, DOB, address and insurance during PV today via interpreter;  Pt given instruction packet with copy of consent form to read and not return;  Pt encouraged to call with questions or issues.  If pt has My chart, procedure instructions sent via My Chart

## 2022-01-03 ENCOUNTER — Encounter: Payer: Self-pay | Admitting: Internal Medicine

## 2022-01-05 ENCOUNTER — Ambulatory Visit (AMBULATORY_SURGERY_CENTER): Payer: 59 | Admitting: Internal Medicine

## 2022-01-05 ENCOUNTER — Encounter: Payer: Self-pay | Admitting: Internal Medicine

## 2022-01-05 ENCOUNTER — Other Ambulatory Visit: Payer: Self-pay

## 2022-01-05 VITALS — BP 114/54 | HR 69 | Temp 98.6°F | Resp 18 | Ht 60.0 in | Wt 179.0 lb

## 2022-01-05 DIAGNOSIS — Z1211 Encounter for screening for malignant neoplasm of colon: Secondary | ICD-10-CM | POA: Diagnosis present

## 2022-01-05 MED ORDER — SODIUM CHLORIDE 0.9 % IV SOLN: 500.0000 mL | Freq: Once | INTRAVENOUS | Status: DC

## 2022-01-05 NOTE — Progress Notes (Signed)
GASTROENTEROLOGY PROCEDURE H&P NOTE   Primary Care Physician: Pcp, No    Reason for Procedure:   Colon cancer screening  Plan:    Colonoscopy  Patient is appropriate for endoscopic procedure(s) in the ambulatory ([facility]) setting.  The nature of the procedure, as well as the risks, benefits, and alternatives were carefully and thoroughly reviewed with the patient. Ample time for discussion and questions allowed. The patient understood, was satisfied, and agreed to proceed.     HPI: Elizabeth Hernandez is a 56 y.o. female who presents for colonoscopy for colon cancer screening. States that she occasionally sees small amount of blood in her stools. Denies weight loss or changes in bowel habits.  Denies fam hx of colon cancer.  Past Medical History:  Diagnosis Date   Depression    on meds-hx of   NSVD (normal spontaneous vaginal delivery)    x2   SAB (spontaneous abortion)    Tension headache    occasional    Past Surgical History:  Procedure Laterality Date   DILATION AND EVACUATION     missed ab   PARTIAL HYSTERECTOMY  2007   still has ovaries    Prior to Admission medications   Medication Sig Start Date End Date Taking? Authorizing Provider  VITAMIN D PO Take 1 tablet by mouth daily at 6 (six) AM.   Yes [provider]    Current Outpatient Medications  Medication Sig Dispense Refill   VITAMIN D PO Take 1 tablet by mouth daily at 6 (six) AM.     Current Facility-Administered Medications  Medication Dose Route Frequency Provider Last Rate Last Admin   0.9 %  sodium chloride infusion  500 mL Intravenous Once Imogene Burn, MD        Allergies as of 01/05/2022   (No Known Allergies)    Family History  Problem Relation Age of Onset   Diabetes Mother    Diabetes Father    Diabetes Maternal Aunt    Diabetes Maternal Uncle    Diabetes Paternal Aunt    Diabetes Paternal Uncle    Diabetes Maternal Grandmother    Breast cancer Neg Hx    Colon  cancer Neg Hx    Colon polyps Neg Hx    Esophageal cancer Neg Hx    Rectal cancer Neg Hx    Stomach cancer Neg Hx     Social History   Socioeconomic History   Marital status: Single    Spouse name: Not on file   Number of children: Not on file   Years of education: Not on file   Highest education level: Not on file  Occupational History   Not on file  Tobacco Use   Smoking status: Never   Smokeless tobacco: Never  Vaping Use   Vaping Use: Never used  Substance and Sexual Activity   Alcohol use: No    Alcohol/week: 0.0 standard drinks   Drug use: No   Sexual activity: Not Currently  Other Topics Concern   Not on file  Social History Narrative   Not on file   Social Determinants of Health   Financial Resource Strain: Not on file  Food Insecurity: Not on file  Transportation Needs: Not on file  Physical Activity: Not on file  Stress: Not on file  Social Connections: Not on file  Intimate Partner Violence: Not on file    Physical Exam: Vital signs in last 24 hours: BP 136/79    Pulse 79  Temp 98.6 F (37 C)    Resp (!) 6    Ht 5' (1.524 m)    Wt 179 lb (81.2 kg)    SpO2 99%    BMI 34.96 kg/m  GEN: NAD EYE: Sclerae anicteric ENT: MMM CV: Non-tachycardic Pulm: No increased work of breathing GI: Soft, NT/ND NEURO:  Alert & Oriented   [name], MD [facility]  01/05/2022 9:22 AM

## 2022-01-05 NOTE — Patient Instructions (Addendum)
Handout on Hemorrhoids given to you today   [facility]:   Lea el informe del procedimiento que se le entreg para cualquier pregunta especfica sobre lo que se Primary school teacher.  Si el informe del examen no responde a sus preguntas, por favor llame a su gastroenterlogo para aclararlo.  Si usted solicit que no se le den [employer] de lo que se Estate manager/land agent en su procedimiento al [employer] va a cuidar, entonces el informe del procedimiento se ha incluido en un sobre sellado para que usted lo revise despus cuando le sea ms conveniente.   LO QUE PUEDE ESPERAR: Algunas sensaciones de hinchazn en el abdomen.  Puede tener ms gases de lo normal.  El caminar puede ayudarle a eliminar el aire que se le puso en el tracto gastrointestinal durante el procedimiento y reducir la hinchazn.  Si le hicieron una endoscopia inferior (como una colonoscopia o una sigmoidoscopia flexible), podra notar manchas de sangre en las heces fecales o en el papel higinico.  Si se someti a una preparacin intestinal para su procedimiento, es posible que no tenga una evacuacin intestinal normal durante [employer].   Tenga en cuenta:  Es posible que note un poco de irritacin y congestin en la nariz o algn drenaje.  Esto es debido al oxgeno [employer] durante su procedimiento.  No hay que preocuparse y esto debe desaparecer ms o Scientist, research (medical).   SNTOMAS PARA REPORTAR INMEDIATAMENTE:  Despus de una endoscopia inferior (colonoscopia o sigmoidoscopia flexible):  Cantidades excesivas de sangre en las heces fecales  Sensibilidad significativa o empeoramiento de los dolores abdominales   Hinchazn aguda del abdomen que antes no tena   Fiebre de 100F o ms   Despus de la endoscopia superior (EGD)  Vmitos de Biochemist, clinical o material como caf molido   Dolor en el pecho o dolor debajo de los omplatos que antes no tena   Dolor o dificultad  persistente para tragar  Falta de aire que antes no tena   Fiebre de 100F o ms  Heces fecales negras y pegajosas   Para asuntos urgentes o de Freight forwarder, puede comunicarse con un gastroenterlogo a cualquier hora llamando al [phone].  DIETA:  Recomendamos una comida pequea al principio, pero luego puede continuar con su dieta normal.  Tome muchos lquidos, Teacher, adult education las bebidas alcohlicas durante 24 horas.    ACTIVIDAD:  Debe planear tomarse las cosas con calma por el resto del da y no debe CONDUCIR ni usar maquinaria pesada Programmer, applications (debido a los medicamentos de sedacin utilizados durante el examen).     SEGUIMIENTO: Nuestro personal llamar al nmero que aparece en su historial al siguiente da hbil de su procedimiento para ver cmo se siente y para responder cualquier pregunta o inquietud que pueda tener con respecto a la informacin que se le dio despus del procedimiento. Si no podemos contactarle, le dejaremos un mensaje.  Sin embargo, si se siente bien y no tiene Paediatric nurse, no es necesario que nos devuelva la llamada.  Asumiremos que ha regresado a sus actividades diarias normales sin incidentes. Si se le tomaron algunas biopsias, le contactaremos por telfono o por carta en las prximas 3 semanas.  Si no ha sabido [employer] biopsias en el transcurso de 3 semanas, por favor llmenos al [phone].   FIRMAS/CONFIDENCIALIDAD: Usted y/o el acompaante que le cuide han firmado documentos que se ingresarn en su  historial mdico electrnico.  Estas firmas atestiguan el hecho de que la informacin anterior  Do not use MyChart messaging for urgent concerns.

## 2022-01-05 NOTE — Op Note (Signed)
[facility] Patient Name: Elizabeth Hernandez Procedure Date: 01/05/2022 9:21 AM MRN: [id number] Endoscopist: Nicole Kindred "[name] ,  Age: 56 Referring MD:  Date of Birth: Nov 12, 1966 Gender: Female Account #: [id number] Procedure:                Colonoscopy Indications:              Screening for colorectal malignant neoplasm, This                            is the patient's first colonoscopy Medicines:                Monitored Anesthesia Care Procedure:                Pre-Anesthesia Assessment:                           - Prior to the procedure, a History and Physical                            was performed, and patient medications and                            allergies were reviewed. The patient's tolerance of                            previous anesthesia was also reviewed. The risks                            and benefits of the procedure and the sedation                            options and risks were discussed with the patient.                            All questions were answered, and informed consent                            was obtained. Prior Anticoagulants: The patient has                            taken no previous anticoagulant or antiplatelet                            agents. ASA Grade Assessment: II - A patient with                            mild systemic disease. After reviewing the risks                            and benefits, the patient was deemed in                            satisfactory condition to undergo the procedure.  After obtaining informed consent, the colonoscope                            was passed under direct vision. Throughout the                            procedure, the patient's blood pressure, pulse, and                            oxygen saturations were monitored continuously. The                            Olympus CF-HQ190L (Serial# [id number]) Colonoscope was                            introduced  through the anus and advanced to the the                            terminal ileum. The colonoscopy was performed                            without difficulty. The patient tolerated the                            procedure well. The quality of the bowel                            preparation was good. The terminal ileum, ileocecal                            valve, appendiceal orifice, and rectum were                            photographed. Scope In: 9:30:47 AM Scope Out: 9:45:16 AM Scope Withdrawal Time: 0 hours 9 minutes 10 seconds  Total Procedure Duration: 0 hours 14 minutes 29 seconds  Findings:                 The terminal ileum appeared normal.                           Non-bleeding internal hemorrhoids were found during                            retroflexion.                           The exam was otherwise without abnormality. Complications:            No immediate complications. Estimated Blood Loss:     Estimated blood loss: none. Impression:               - The examined portion of the ileum was normal.                           - Non-bleeding internal hemorrhoids.                           -  The examination was otherwise normal.                           - No specimens collected. Recommendation:           - Discharge patient to home (with escort).                           - Repeat colonoscopy in 10 years for screening                            purposes.                           - The findings and recommendations were discussed                            with the patient. Nicole Kindred "[name],  01/05/2022 9:52:53 AM

## 2022-01-05 NOTE — Progress Notes (Signed)
To PACU, VSS. Report to [email or web address] 

## 2022-01-05 NOTE — Progress Notes (Signed)
Pt's states no medical or surgical changes since previsit or office visit. VS by CW. Interpreter, Nile Riggs, present during admission.

## 2022-01-09 ENCOUNTER — Telehealth: Payer: Self-pay

## 2022-01-09 ENCOUNTER — Telehealth: Payer: Self-pay | Admitting: *Deleted

## 2022-01-09 NOTE — Telephone Encounter (Signed)
Second attempt follow up call to pt, no answer.  

## 2022-01-09 NOTE — Telephone Encounter (Signed)
No answer on first follow call. Mail box not set up.

## 2022-10-11 ENCOUNTER — Encounter: Payer: Self-pay | Admitting: Nurse Practitioner

## 2022-10-11 ENCOUNTER — Ambulatory Visit (INDEPENDENT_AMBULATORY_CARE_PROVIDER_SITE_OTHER): Payer: 59 | Admitting: Nurse Practitioner

## 2022-10-11 VITALS — BP 108/64 | HR 72 | Resp 20 | Ht 60.24 in | Wt 166.8 lb

## 2022-10-11 DIAGNOSIS — E785 Hyperlipidemia, unspecified: Secondary | ICD-10-CM | POA: Diagnosis not present

## 2022-10-11 DIAGNOSIS — Z833 Family history of diabetes mellitus: Secondary | ICD-10-CM | POA: Diagnosis not present

## 2022-10-11 DIAGNOSIS — Z01419 Encounter for gynecological examination (general) (routine) without abnormal findings: Secondary | ICD-10-CM

## 2022-10-11 LAB — LIPID PANEL
Cholesterol: 181 mg/dL (ref <200)
HDL: 53 mg/dL (ref >=50)
LDL Cholesterol (Calc): 111 mg/dL (calc) — ABNORMAL HIGH
Non-HDL Cholesterol (Calc): 128 mg/dL (calc) (ref <130)
Total CHOL/HDL Ratio: 3.4 (calc) (ref <5.0)
Triglycerides: 84 mg/dL (ref <150)

## 2022-10-11 LAB — COMPREHENSIVE METABOLIC PANEL
AG Ratio: 1.5 (calc) (ref 1.0–2.5)
ALT: 16 U/L (ref 6–29)
AST: 21 U/L (ref 10–35)
Albumin: 4 g/dL (ref 3.6–5.1)
Alkaline phosphatase (APISO): 70 U/L (ref 37–153)
BUN: 16 mg/dL (ref 7–25)
CO2: 30 mmol/L (ref 20–32)
Calcium: 9.5 mg/dL (ref 8.6–10.4)
Chloride: 106 mmol/L (ref 98–110)
Creat: 0.73 mg/dL (ref 0.50–1.03)
Globulin: 2.7 g/dL (calc) (ref 1.9–3.7)
Glucose, Bld: 95 mg/dL (ref 65–99)
Potassium: 4.6 mmol/L (ref 3.5–5.3)
Sodium: 141 mmol/L (ref 135–146)
Total Bilirubin: 0.5 mg/dL (ref 0.2–1.2)
Total Protein: 6.7 g/dL (ref 6.1–8.1)

## 2022-10-11 LAB — CBC WITH DIFFERENTIAL/PLATELET
Absolute Monocytes: 422 cells/uL (ref 200–950)
Basophils Absolute: 40 cells/uL (ref 0–200)
Basophils Relative: 0.7 %
Eosinophils Absolute: 239 cells/uL (ref 15–500)
Eosinophils Relative: 4.2 %
HCT: 39.5 % (ref 35.0–45.0)
Hemoglobin: 13.1 g/dL (ref 11.7–15.5)
Lymphs Abs: 1870 cells/uL (ref 850–3900)
MCH: 30 pg (ref 27.0–33.0)
MCHC: 33.2 g/dL (ref 32.0–36.0)
MCV: 90.6 fL (ref 80.0–100.0)
MPV: 11.4 fL (ref 7.5–12.5)
Monocytes Relative: 7.4 %
Neutro Abs: 3129 cells/uL (ref 1500–7800)
Neutrophils Relative %: 54.9 %
Platelets: 257 10*3/uL (ref 140–400)
RBC: 4.36 10*6/uL (ref 3.80–5.10)
RDW: 13.5 % (ref 11.0–15.0)
Total Lymphocyte: 32.8 %
WBC: 5.7 10*3/uL (ref 3.8–10.8)

## 2022-10-11 LAB — HEMOGLOBIN A1C
Hgb A1c MFr Bld: 6.1 % of total Hgb — ABNORMAL HIGH (ref <5.7)
Mean Plasma Glucose: 128 mg/dL
eAG (mmol/L): 7.1 mmol/L

## 2022-10-11 NOTE — Progress Notes (Signed)
   Elizabeth Hernandez 1966/10/27 [id number]   History:  56 y.o. [id number] presents for annual exam. No GYN complaints. S/P 2007 TAH for adenomyosis and chronic pelvic pain. Normal pap and mammogram history. Audio interpreter used during visit.   Gynecologic History No LMP recorded. Patient has had a hysterectomy.   Contraception: status post hysterectomy Sexually active: Yes  Health maintenance Last Pap: 06/11/2011. Results were: Normal Last mammogram: 12/08/2021. Results were: Normal Last colonoscopy: 01/05/2022. Results were: Normal, 10-year recall Last Dexa: Never  Past medical history, past surgical history, family history and social history were all reviewed and documented in the EPIC chart. Married. Works for [employer]. 2 children, 2 grandchildren ages 89 and 65 yo. Significant family history of diabetes.   ROS:  A ROS was performed and pertinent positives and negatives are included.  Exam:  Vitals:   10/11/22 1011  BP: 108/64  Pulse: 72  Resp: 20  SpO2: 99%  Weight: 166 lb 12.8 oz (75.7 kg)  Height: 5' 0.24" (1.53 m)     Body mass index is 32.32 kg/m.  General appearance:  Normal Thyroid:  Symmetrical, normal in size, without palpable masses or nodularity. Respiratory  Auscultation:  Clear without wheezing or rhonchi Cardiovascular  Auscultation:  Regular rate, without rubs, murmurs or gallops  Edema/varicosities:  Not grossly evident Abdominal  Soft,nontender, without masses, guarding or rebound.  Liver/spleen:  No organomegaly noted  Hernia:  None appreciated  Skin  Inspection:  Grossly normal   Breasts: Examined lying and sitting.   Right: Without masses, retractions, discharge or axillary adenopathy.   Left: Without masses, retractions, discharge or axillary adenopathy. Genitourinary   Inguinal/mons:  Normal without inguinal adenopathy  External genitalia:  Normal appearing vulva with no masses, tenderness, or lesions  BUS/Urethra/Skene's glands:   Normal  Vagina:  Normal appearing with normal color and discharge, no lesions  Cervix:  Absent  Uterus:  Absent  Adnexa/parametria:     Rt: Normal in size, without masses or tenderness.   Lt: Normal in size, without masses or tenderness.  Anus and perineum: Normal  Digital rectal exam: Normal sphincter tone without palpated masses or tenderness  Patient informed chaperone available to be present for breast and pelvic exam. Patient has requested no chaperone to be present. Patient has been advised what will be completed during breast and pelvic exam.   Assessment/Plan:  56 y.o. [id number] for annual exam.   Well female exam with routine gynecological exam - Plan: CBC with Differential/Platelet, Comprehensive metabolic panel. Education provided on SBEs, importance of preventative screenings, current guidelines, high calcium diet, regular exercise, and multivitamin daily.   Hyperlipidemia, unspecified hyperlipidemia type - Plan: Lipid panel  Family history of diabetes mellitus - Plan: Hemoglobin A1c  Screening for cervical cancer - Normal pap history. No longer screening per guidelines.   Screening for breast cancer - Normal mammogram history. Continue annual screenings. Normal breast exam today.   Screening for colon cancer - 12/2021 colonoscopy. Will repeat at 10-year interval per GI recommendations.   Screening for osteoporosis - Average risk. Will plan DXA at age 78.   Follow up in 1 year for annual.       [name] [facility], 10:33 AM 10/11/2022

## 2022-11-05 NOTE — Progress Notes (Signed)
Error

## 2022-11-08 ENCOUNTER — Other Ambulatory Visit: Payer: Self-pay | Admitting: Nurse Practitioner

## 2022-11-08 DIAGNOSIS — Z1231 Encounter for screening mammogram for malignant neoplasm of breast: Secondary | ICD-10-CM

## 2023-01-03 ENCOUNTER — Ambulatory Visit
Admission: RE | Admit: 2023-01-03 | Discharge: 2023-01-03 | Disposition: A | Discharge status: Home / Self Care | Payer: 59 | Source: Ambulatory Visit | Attending: Nurse Practitioner | Admitting: Nurse Practitioner

## 2023-01-03 DIAGNOSIS — Z1231 Encounter for screening mammogram for malignant neoplasm of breast: Secondary | ICD-10-CM

## 2023-06-19 IMAGING — MG MM DIGITAL SCREENING BILAT W/ TOMO AND CAD
8 series · 8 of 24 positions shown · non-contrast
Comparison: Previous exam(s).

CLINICAL DATA: Screening.

EXAM:
DIGITAL SCREENING BILATERAL MAMMOGRAM WITH TOMOSYNTHESIS AND CAD
TECHNIQUE: Bilateral screening digital craniocaudal and mediolateral oblique
mammograms were obtained. Bilateral screening digital breast
tomosynthesis was performed. The images were evaluated with
computer-aided detection.

[R CC synth-2D]
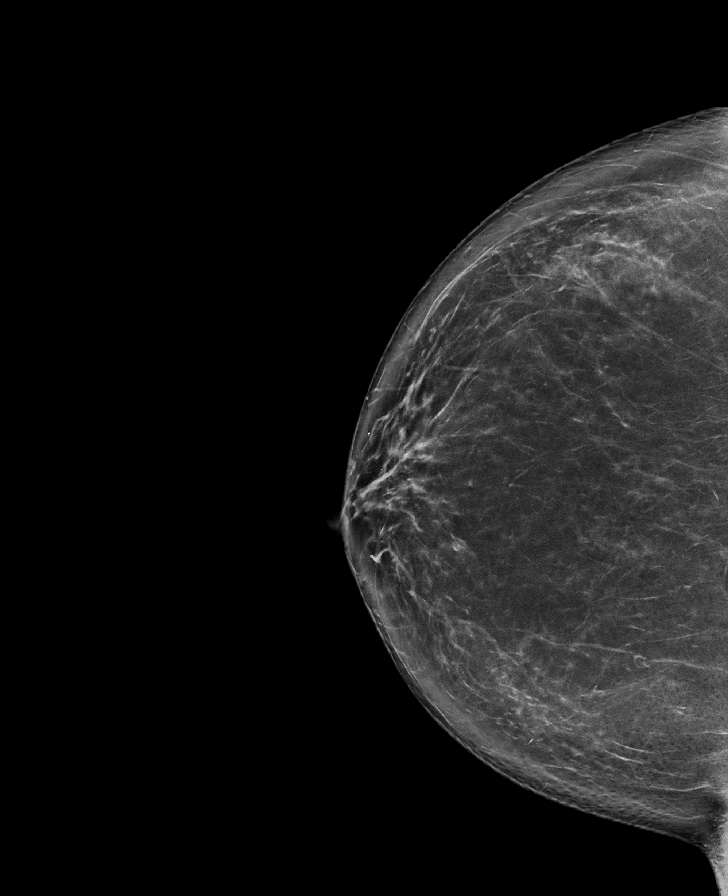

[L MLO synth-2D]
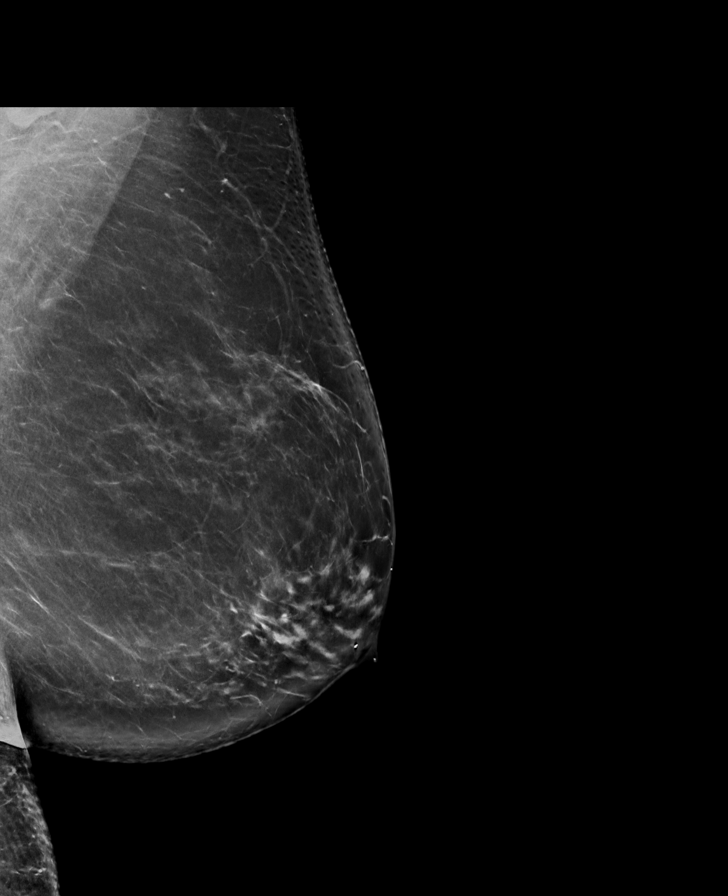

[L CC synth-2D]
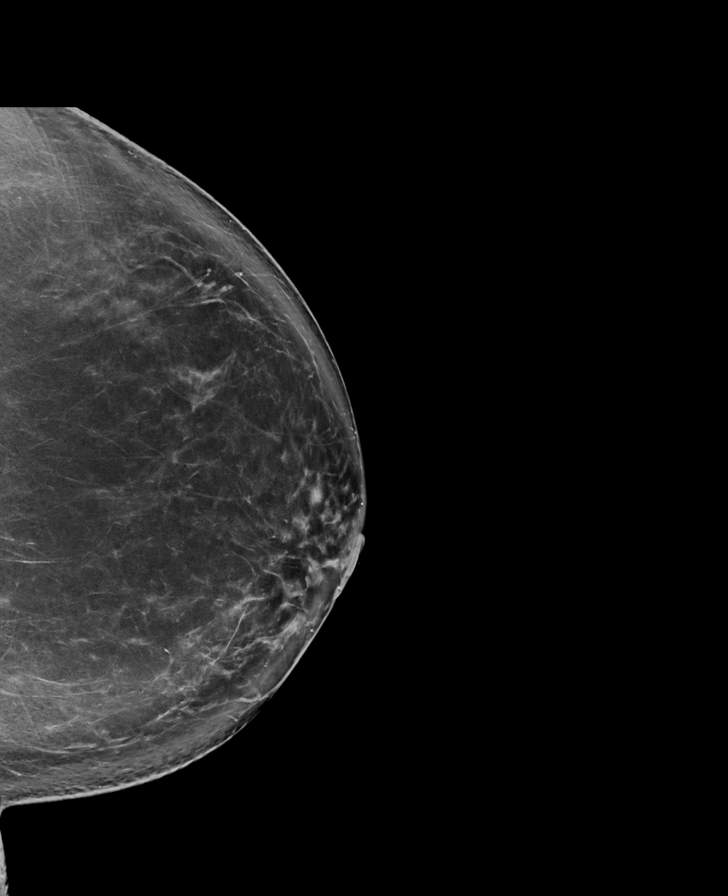

[R MLO synth-2D]
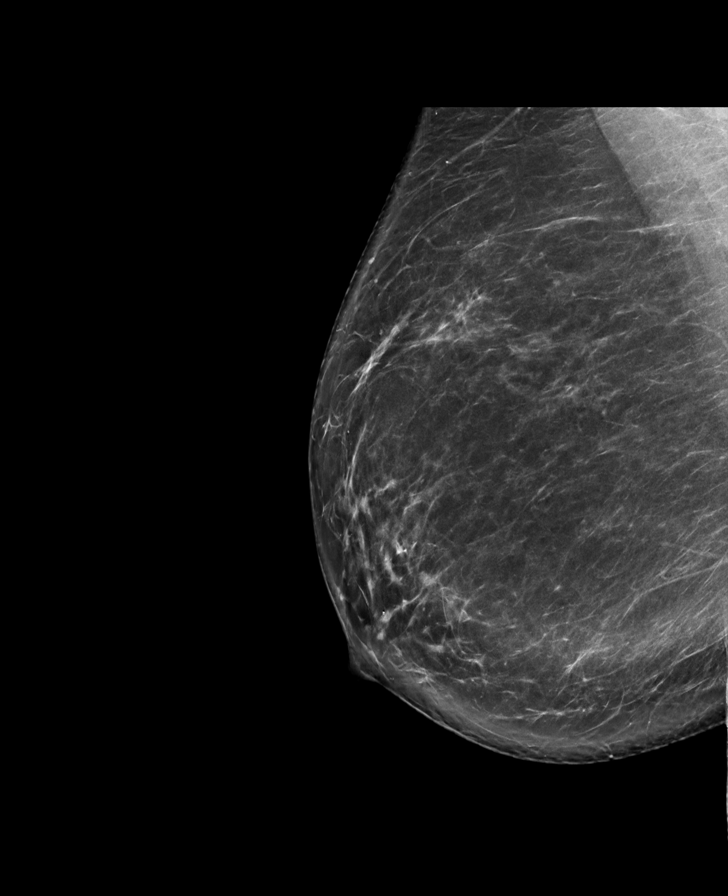

[R MLO tomo · tomo slice 49/96.0]
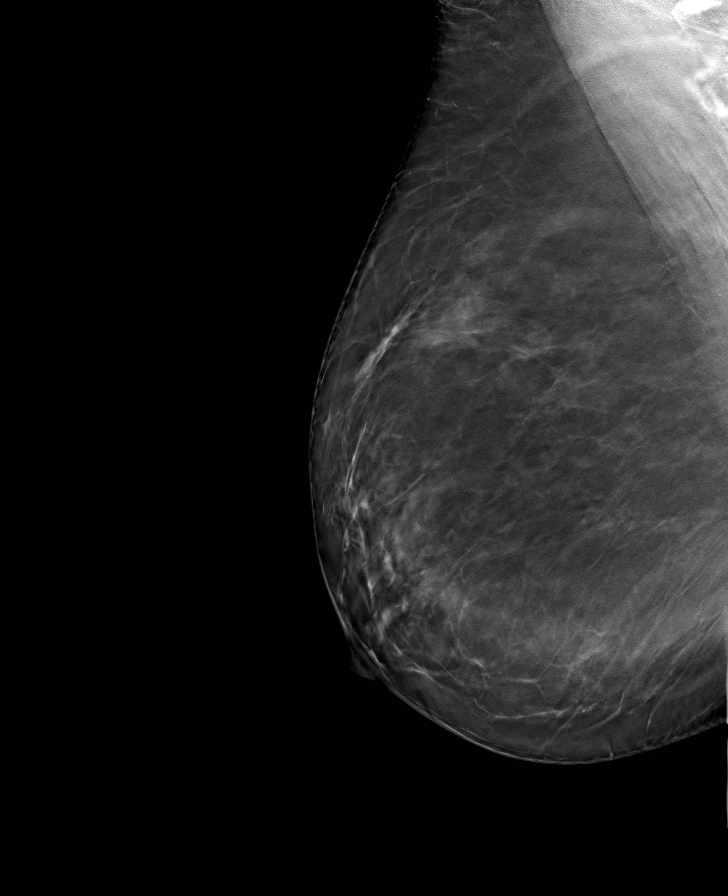

[R CC tomo · tomo slice 47/93.0]
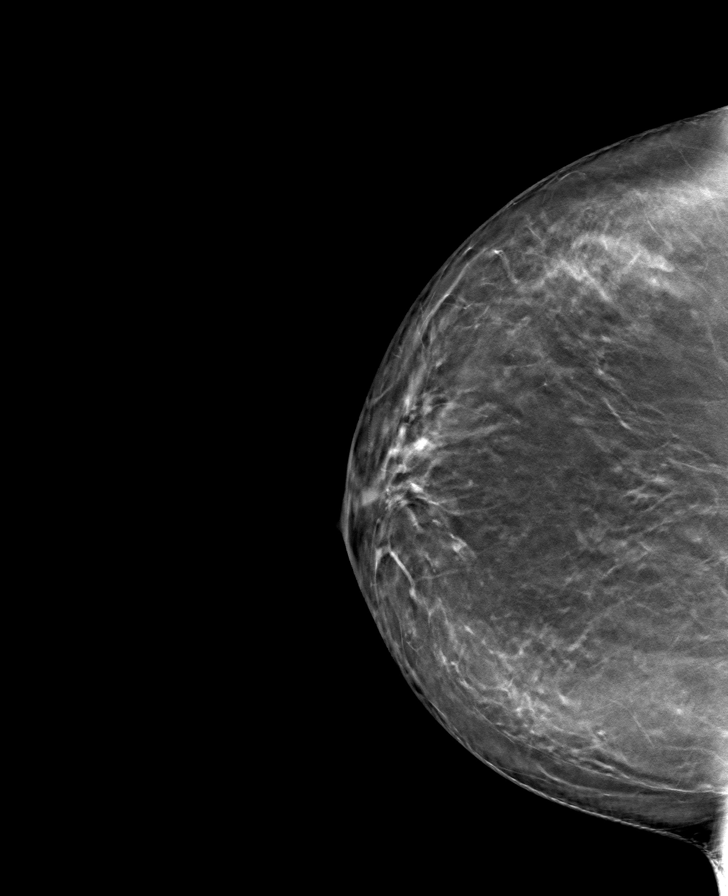

[L CC tomo · tomo slice 51/100.0]
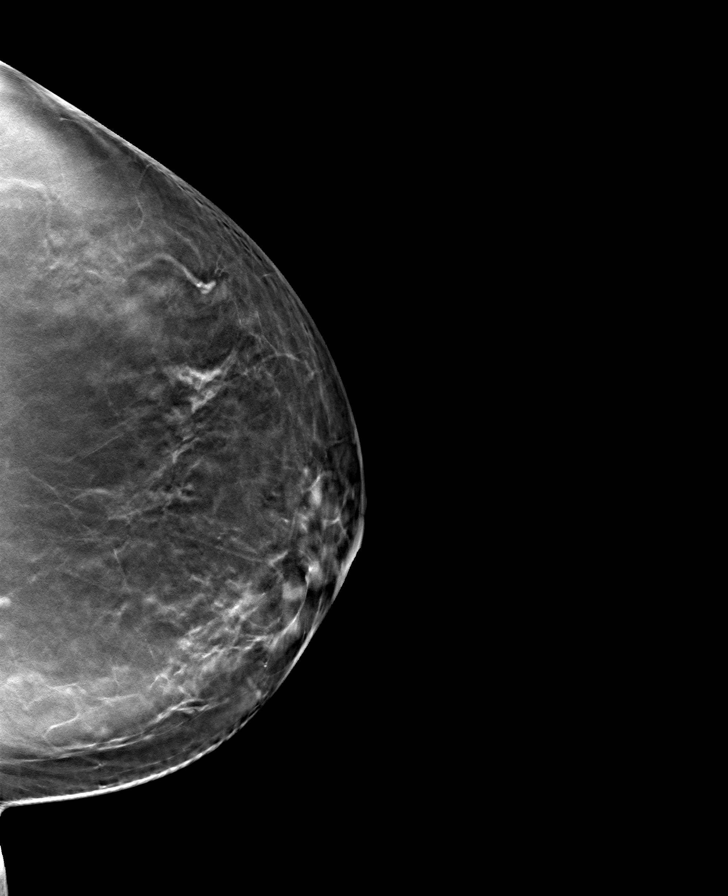

[L MLO tomo · tomo slice 51/102.0]
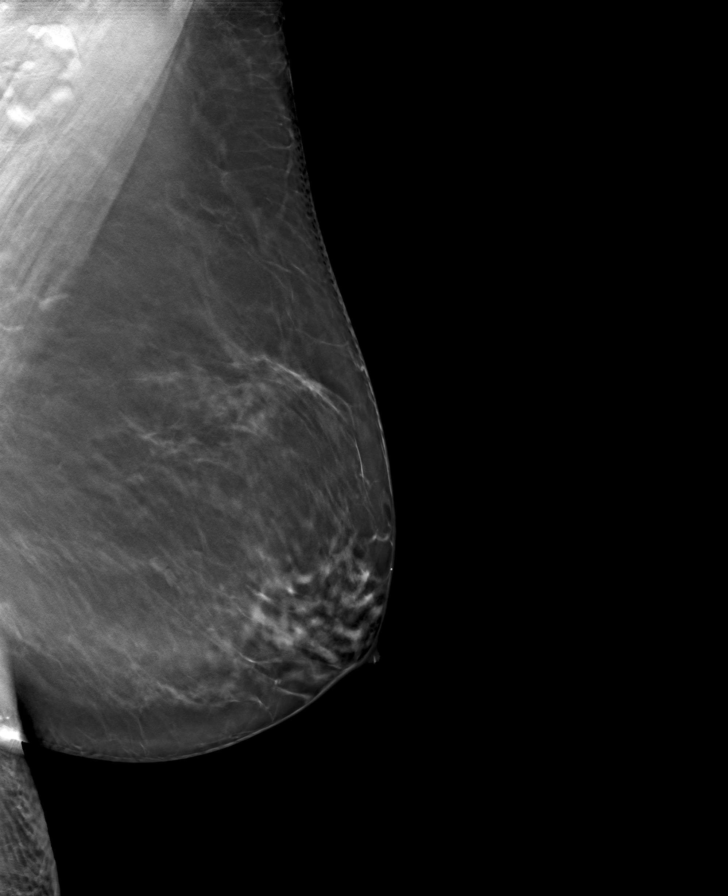

[8 of 24 positions shown; findings below may reference images not displayed]

ACR Breast Density Category b: There are scattered areas of
fibroglandular density.
FINDINGS: There are no findings suspicious for malignancy.
IMPRESSION: No mammographic evidence of malignancy. A result letter of this
screening mammogram will be mailed directly to the patient.

RECOMMENDATION:
Screening mammogram in one year. (Code:51-O-LD2)

BI-RADS CATEGORY  1: Negative.

## 2023-06-20 ENCOUNTER — Encounter: Payer: Self-pay | Admitting: Internal Medicine

## 2023-06-20 ENCOUNTER — Ambulatory Visit: Payer: 59 | Admitting: Internal Medicine

## 2023-06-20 VITALS — BP 110/70 | HR 92 | Temp 98.7°F | Ht 60.0 in | Wt 171.4 lb

## 2023-06-20 DIAGNOSIS — K219 Gastro-esophageal reflux disease without esophagitis: Secondary | ICD-10-CM

## 2023-06-20 MED ORDER — PANTOPRAZOLE SODIUM 40 MG PO TBEC: 40.0000 mg | DELAYED_RELEASE_TABLET | Freq: Every day | ORAL | 1 refills | Status: DC

## 2023-06-20 NOTE — Progress Notes (Signed)
New Patient Office Visit     CC/Reason for Visit: Establish care, discuss acute concerns Previous PCP: None Last Visit: Unknown  HPI: Elizabeth Hernandez is a 57 y.o. female who is coming in today for the above mentioned reasons.  She has no past medical history of significance, she does not smoke or drink, no allergies, her past surgical history significant for a total hysterectomy, no family history of significance.  She does not take any chronic medication.  She is originally from [place].  She follows with GYN routinely last in December 2023.  For the past 6 weeks or so she has been experiencing what she describes as a fire in her throat.  She feels like it is worse at nighttime, worse after eating spicy food or tomato-based products.  She has something similar when she was young and does not recall what it improved with.  She will sometimes take milk that helps.  No perching, no nausea.   Past Medical/Surgical History: Past Medical History:  Diagnosis Date   Depression    on meds-hx of   NSVD (normal spontaneous vaginal delivery)    x2   SAB (spontaneous abortion)    Tension headache    occasional    Past Surgical History:  Procedure Laterality Date   DILATION AND EVACUATION     missed ab   PARTIAL HYSTERECTOMY  2007   still has ovaries    Social History:  reports that she has never smoked. She has never used smokeless tobacco. She reports that she does not drink alcohol and does not use drugs.  Allergies: No Active Allergies  Family History:  Family History  Problem Relation Age of Onset   Diabetes Mother    Diabetes Father    Diabetes Maternal Aunt    Diabetes Maternal Uncle    Diabetes Paternal Aunt    Diabetes Paternal Uncle    Diabetes Maternal Grandmother    Breast cancer Neg Hx    Colon cancer Neg Hx    Colon polyps Neg Hx    Esophageal cancer Neg Hx    Rectal cancer Neg Hx    Stomach cancer Neg Hx      Current Outpatient Medications:     pantoprazole (PROTONIX) 40 MG tablet, Take 1 tablet (40 mg total) by mouth daily., Disp: 90 tablet, Rfl: 1   VITAMIN D PO, Take 1 tablet by mouth daily at 6 (six) AM. (Patient not taking: Reported on 06/20/2023), Disp: , Rfl:   Review of Systems:  Negative except as indicated in HPI.   Physical Exam: Vitals:   06/20/23 1415  BP: 110/70  Pulse: 92  Temp: 98.7 F (37.1 C)  TempSrc: Oral  SpO2: 99%  Weight: 171 lb 6.4 oz (77.7 kg)  Height: 5' (1.524 m)   Body mass index is 33.47 kg/m.  Physical Exam Vitals reviewed.  Constitutional:      Appearance: Normal appearance.  HENT:     Head: Normocephalic and atraumatic.  Eyes:     Conjunctiva/sclera: Conjunctivae normal.     Pupils: Pupils are equal, round, and reactive to light.  Cardiovascular:     Rate and Rhythm: Normal rate and regular rhythm.  Pulmonary:     Effort: Pulmonary effort is normal.     Breath sounds: Normal breath sounds.  Skin:    General: Skin is warm and dry.  Neurological:     General: No focal deficit present.     Mental Status: She is alert and  oriented to person, place, and time.  Psychiatric:        Mood and Affect: Mood normal.        Behavior: Behavior normal.        Thought Content: Thought content normal.        Judgment: Judgment normal.       Impression and Plan:  Gastroesophageal reflux disease, unspecified whether esophagitis present -     Pantoprazole Sodium; Take 1 tablet (40 mg total) by mouth daily.  Dispense: 90 tablet; Refill: 1  -Suspect symptoms are related to GERD.  Start pantoprazole 40 mg daily.  If no improvement consider GI referral for EGD. -She will schedule follow-up for annual physical.  Time spent: 46 minutes reviewing chart, interviewing and examining patient and formulating plan of care.     Chaya Jan, MD [facility]

## 2023-10-14 ENCOUNTER — Encounter: Payer: Self-pay | Admitting: Nurse Practitioner

## 2023-10-14 ENCOUNTER — Ambulatory Visit (INDEPENDENT_AMBULATORY_CARE_PROVIDER_SITE_OTHER): Payer: 59 | Admitting: Nurse Practitioner

## 2023-10-14 VITALS — BP 132/80 | HR 75 | Ht 60.0 in | Wt 164.0 lb

## 2023-10-14 DIAGNOSIS — Z01419 Encounter for gynecological examination (general) (routine) without abnormal findings: Secondary | ICD-10-CM | POA: Diagnosis not present

## 2023-10-14 DIAGNOSIS — K219 Gastro-esophageal reflux disease without esophagitis: Secondary | ICD-10-CM | POA: Diagnosis not present

## 2023-10-14 DIAGNOSIS — R7303 Prediabetes: Secondary | ICD-10-CM | POA: Diagnosis not present

## 2023-10-14 DIAGNOSIS — E7800 Pure hypercholesterolemia, unspecified: Secondary | ICD-10-CM

## 2023-10-14 NOTE — Progress Notes (Signed)
Elizabeth Hernandez Jun 22, 1966 [id number]   History:  57 y.o. [id number] presents for annual exam. No GYN complaints. S/P 2007 TAH for adenomyosis and chronic pelvic pain. Normal pap and mammogram history. Has been having excessive phlegm and burning in throat. Worse at night and after eating spicy/acidic foods. No burping or nausea. PCP provided Protonix a few months ago with no improvement. Would like GI referral. Has been seen at [facility] in the past. Interpreter present during visit.   Gynecologic History No LMP recorded. Patient has had a hysterectomy.   Contraception: status post hysterectomy Sexually active: Yes  Health maintenance Last Pap: 06/11/2011. Results were: Normal Last mammogram: 01/03/2023. Results were: Normal Last colonoscopy: 01/05/2022. Results were: Normal, 10-year recall Last Dexa: Never  Past medical history, past surgical history, family history and social history were all reviewed and documented in the EPIC chart. Married. Works for [employer]. 2 children, 2 grandsons ages 40 and 65. Significant family history of diabetes.   ROS:  A ROS was performed and pertinent positives and negatives are included.  Exam:  Vitals:   10/14/23 0903  BP: 132/80  Pulse: 75  SpO2: 99%  Weight: 164 lb (74.4 kg)  Height: 5' (1.524 m)      Body mass index is 32.03 kg/m.  General appearance:  Normal Thyroid:  Symmetrical, normal in size, without palpable masses or nodularity. Respiratory  Auscultation:  Clear without wheezing or rhonchi Cardiovascular  Auscultation:  Regular rate, without rubs, murmurs or gallops  Edema/varicosities:  Not grossly evident Abdominal  Soft,nontender, without masses, guarding or rebound.  Liver/spleen:  No organomegaly noted  Hernia:  None appreciated  Skin  Inspection:  Grossly normal   Breasts: Examined lying and sitting.   Right: Without masses, retractions, discharge or axillary adenopathy.   Left: Without masses,  retractions, discharge or axillary adenopathy. Genitourinary   Inguinal/mons:  Normal without inguinal adenopathy  External genitalia:  Normal appearing vulva with no masses, tenderness, or lesions  BUS/Urethra/Skene's glands:  Normal  Vagina:  Normal appearing with normal color and discharge, no lesions  Cervix:  Absent  Uterus:  Absent  Adnexa/parametria:     Rt: Normal in size, without masses or tenderness.   Lt: Normal in size, without masses or tenderness.  Anus and perineum: Normal  Digital rectal exam: Not indicated  Patient informed chaperone available to be present for breast and pelvic exam. Patient has requested no chaperone to be present. Patient has been advised what will be completed during breast and pelvic exam.   Assessment/Plan:  57 y.o. A6T0160 for annual exam.   Well female exam with routine gynecological exam - Plan: CBC with Differential/Platelet, Comprehensive metabolic panel. Education provided on SBEs, importance of preventative screenings, current guidelines, high calcium diet, regular exercise, and multivitamin daily.   Hyperlipidemia, unspecified hyperlipidemia type - Plan: Lipid panel  Family history of diabetes mellitus - Plan: Hemoglobin A1c  Gastroesophageal reflux disease, unspecified whether esophagitis present - Plan: Ambulatory referral to Gastroenterology. Recommend avoiding acidic/spicy foods.  Screening for cervical cancer - Normal pap history. No longer screening per guidelines.   Screening for breast cancer - Normal mammogram history. Continue annual screenings. Normal breast exam today.   Screening for colon cancer - 12/2021 colonoscopy. Will repeat at 10-year interval per GI recommendations.   Screening for osteoporosis - Average risk. Will plan DXA at age 72.   Return in about 1 year (around 10/13/2024) for Annual.       [name] [facility], 9:20  AM 10/14/2023

## 2023-10-15 LAB — CBC WITH DIFFERENTIAL/PLATELET
Absolute Lymphocytes: 1495 {cells}/uL (ref 850–3900)
Absolute Monocytes: 308 {cells}/uL (ref 200–950)
Basophils Absolute: 28 {cells}/uL (ref 0–200)
Basophils Relative: 0.6 %
Eosinophils Absolute: 147 {cells}/uL (ref 15–500)
Eosinophils Relative: 3.2 %
HCT: 42.6 % (ref 35.0–45.0)
Hemoglobin: 13.7 g/dL (ref 11.7–15.5)
MCH: 29.3 pg (ref 27.0–33.0)
MCHC: 32.2 g/dL (ref 32.0–36.0)
MCV: 91 fL (ref 80.0–100.0)
MPV: 11.4 fL (ref 7.5–12.5)
Monocytes Relative: 6.7 %
Neutro Abs: 2622 {cells}/uL (ref 1500–7800)
Neutrophils Relative %: 57 %
Platelets: 270 10*3/uL (ref 140–400)
RBC: 4.68 10*6/uL (ref 3.80–5.10)
RDW: 13.9 % (ref 11.0–15.0)
Total Lymphocyte: 32.5 %
WBC: 4.6 10*3/uL (ref 3.8–10.8)

## 2023-10-15 LAB — COMPREHENSIVE METABOLIC PANEL
AG Ratio: 1.4 (calc) (ref 1.0–2.5)
ALT: 27 U/L (ref 6–29)
AST: 21 U/L (ref 10–35)
Albumin: 4 g/dL (ref 3.6–5.1)
Alkaline phosphatase (APISO): 74 U/L (ref 37–153)
BUN: 18 mg/dL (ref 7–25)
CO2: 28 mmol/L (ref 20–32)
Calcium: 9.2 mg/dL (ref 8.6–10.4)
Chloride: 106 mmol/L (ref 98–110)
Creat: 0.66 mg/dL (ref 0.50–1.03)
Globulin: 2.9 g/dL (ref 1.9–3.7)
Glucose, Bld: 112 mg/dL — ABNORMAL HIGH (ref 65–99)
Potassium: 3.8 mmol/L (ref 3.5–5.3)
Sodium: 140 mmol/L (ref 135–146)
Total Bilirubin: 0.2 mg/dL (ref 0.2–1.2)
Total Protein: 6.9 g/dL (ref 6.1–8.1)

## 2023-10-15 LAB — LIPID PANEL
Cholesterol: 180 mg/dL (ref <200)
HDL: 56 mg/dL (ref >=50)
LDL Cholesterol (Calc): 108 mg/dL — ABNORMAL HIGH
Non-HDL Cholesterol (Calc): 124 mg/dL (ref <130)
Total CHOL/HDL Ratio: 3.2 (calc) (ref <5.0)
Triglycerides: 73 mg/dL (ref <150)

## 2023-10-15 LAB — HEMOGLOBIN A1C
Hgb A1c MFr Bld: 6.1 %{Hb} — ABNORMAL HIGH (ref <5.7)
Mean Plasma Glucose: 128 mg/dL
eAG (mmol/L): 7.1 mmol/L

## 2023-11-21 ENCOUNTER — Other Ambulatory Visit: Payer: Self-pay | Admitting: Nurse Practitioner

## 2023-11-21 DIAGNOSIS — Z1231 Encounter for screening mammogram for malignant neoplasm of breast: Secondary | ICD-10-CM

## 2024-01-06 ENCOUNTER — Ambulatory Visit
Admission: RE | Admit: 2024-01-06 | Discharge: 2024-01-06 | Disposition: A | Discharge status: Home / Self Care | Payer: 59 | Source: Ambulatory Visit | Attending: Nurse Practitioner

## 2024-01-06 DIAGNOSIS — Z1231 Encounter for screening mammogram for malignant neoplasm of breast: Secondary | ICD-10-CM

## 2024-10-14 NOTE — Progress Notes (Unsigned)
 Elizabeth Hernandez [birth year] [id number]   History:  58 y.o. [id number] presents for annual exam. Not sexually active due to pain. S/P 2007 TAH for adenomyosis and chronic pelvic pain. Normal pap history. Prediabetes. PCP recommended. Interpreter present during visit.   Gynecologic History No LMP recorded. Patient has had a hysterectomy.   Contraception: status post hysterectomy Sexually active: No  Health maintenance Last Pap: 06/11/2011. Results were: Normal Last mammogram: 01/06/2024. Results were: Normal Last colonoscopy: 01/05/2022. Results were: Normal, 10-year recall Last Dexa: Never     10/15/2024    7:45 AM  Depression screen PHQ 2/9  Decreased Interest 0  Down, Depressed, Hopeless 0  PHQ - 2 Score 0     Past medical history, past surgical history, family history and social history were all reviewed and documented in the EPIC chart. Married. Works for [employer]. 2 children, 2 grandsons ages 42 and 58. Significant family history of diabetes.   ROS:  A ROS was performed and pertinent positives and negatives are included.  Exam:  Vitals:   10/15/24 0741  BP: 110/70  Pulse: 82  SpO2: 99%  Weight: 159 lb (72.1 kg)  Height: 5' (1.524 m)    Body mass index is 31.05 kg/m.  General appearance:  Normal Thyroid :  Symmetrical, normal in size, without palpable masses or nodularity. Respiratory  Auscultation:  Clear without wheezing or rhonchi Cardiovascular  Auscultation:  Regular rate, without rubs, murmurs or gallops  Edema/varicosities:  Not grossly evident Abdominal  Soft,nontender, without masses, guarding or rebound.  Liver/spleen:  No organomegaly noted  Hernia:  None appreciated  Skin  Inspection:  Grossly normal   Breasts: Examined lying and sitting.   Right: Without masses, retractions, discharge or axillary adenopathy.   Left: Without masses, retractions, discharge or axillary adenopathy. Pelvic: External genitalia:  no lesions               Urethra:  normal appearing urethra with no masses, tenderness or lesions              Bartholins and Skenes: normal                 Vagina: normal appearing vagina with normal color and discharge, no lesions. Atrophic changes              Cervix: absent Bimanual Exam:  Uterus:  absent              Adnexa: no mass, fullness, tenderness              Rectovaginal: Deferred              Anus:  normal, no lesions  Elizabeth Hernandez, CMA present as chaperone.   Assessment/Plan:  58 y.o. [id number] for annual exam.   Well female exam with routine gynecological exam - Plan: CBC with Differential/Platelet, Comprehensive metabolic panel with GFR. Education provided on SBEs, importance of preventative screenings, curren  Depression screening - PHQ - 0  Postmenopausal - no HRT  Hyperlipidemia, unspecified hyperlipidemia type - Plan: Lipid panel  Prediabetes - Plan: Hemoglobin A1c  Dyspareunia in female - Provided samples of [employer] Lube. Offered vaginal estrogen but not interested at this time.   Screening for cervical cancer - Normal pap history. No longer screening per guidelines.   Screening for breast cancer - Normal mammogram history. Continue annual screenings. Normal breast exam today.   Screening for colon cancer - 12/2021 colonoscopy. Will repeat at 10-year interval per GI recommendations.   Screening for  osteoporosis - Average risk. Will plan DXA at age 10.   Return in about 1 year (around 10/15/2025) for Annual.       Elizabeth Hernandez, 8:01 AM 10/15/2024

## 2024-10-15 ENCOUNTER — Ambulatory Visit (INDEPENDENT_AMBULATORY_CARE_PROVIDER_SITE_OTHER): Admitting: Nurse Practitioner

## 2024-10-15 ENCOUNTER — Encounter: Payer: Self-pay | Admitting: Nurse Practitioner

## 2024-10-15 VITALS — BP 110/70 | HR 82 | Ht 60.0 in | Wt 159.0 lb

## 2024-10-15 DIAGNOSIS — Z1331 Encounter for screening for depression: Secondary | ICD-10-CM | POA: Diagnosis not present

## 2024-10-15 DIAGNOSIS — Z780 Asymptomatic menopausal state: Secondary | ICD-10-CM | POA: Diagnosis not present

## 2024-10-15 DIAGNOSIS — Z01419 Encounter for gynecological examination (general) (routine) without abnormal findings: Secondary | ICD-10-CM

## 2024-10-15 DIAGNOSIS — E785 Hyperlipidemia, unspecified: Secondary | ICD-10-CM

## 2024-10-15 DIAGNOSIS — R7303 Prediabetes: Secondary | ICD-10-CM

## 2024-10-15 DIAGNOSIS — N941 Unspecified dyspareunia: Secondary | ICD-10-CM

## 2024-10-16 ENCOUNTER — Ambulatory Visit: Payer: Self-pay | Admitting: Nurse Practitioner

## 2024-10-16 LAB — CBC WITH DIFFERENTIAL/PLATELET
Absolute Lymphocytes: 1544 {cells}/uL (ref 850–3900)
Absolute Monocytes: 268 {cells}/uL (ref 200–950)
Basophils Absolute: 28 {cells}/uL (ref 0–200)
Basophils Relative: 0.7 %
Eosinophils Absolute: 108 {cells}/uL (ref 15–500)
Eosinophils Relative: 2.7 %
HCT: 39.9 % (ref 35.0–45.0)
Hemoglobin: 12.7 g/dL (ref 11.7–15.5)
MCH: 29.6 pg (ref 27.0–33.0)
MCHC: 31.8 g/dL — ABNORMAL LOW (ref 32.0–36.0)
MCV: 93 fL (ref 80.0–100.0)
MPV: 11.6 fL (ref 7.5–12.5)
Monocytes Relative: 6.7 %
Neutro Abs: 2052 {cells}/uL (ref 1500–7800)
Neutrophils Relative %: 51.3 %
Platelets: 261 Thousand/uL (ref 140–400)
RBC: 4.29 Million/uL (ref 3.80–5.10)
RDW: 13.5 % (ref 11.0–15.0)
Total Lymphocyte: 38.6 %
WBC: 4 Thousand/uL (ref 3.8–10.8)

## 2024-10-16 LAB — COMPREHENSIVE METABOLIC PANEL WITH GFR
AG Ratio: 1.7 (calc) (ref 1.0–2.5)
ALT: 12 U/L (ref 6–29)
AST: 19 U/L (ref 10–35)
Albumin: 4 g/dL (ref 3.6–5.1)
Alkaline phosphatase (APISO): 54 U/L (ref 37–153)
BUN: 18 mg/dL (ref 7–25)
CO2: 28 mmol/L (ref 20–32)
Calcium: 9.2 mg/dL (ref 8.6–10.4)
Chloride: 108 mmol/L (ref 98–110)
Creat: 0.7 mg/dL (ref 0.50–1.03)
Globulin: 2.4 g/dL (ref 1.9–3.7)
Glucose, Bld: 100 mg/dL — ABNORMAL HIGH (ref 65–99)
Potassium: 4.3 mmol/L (ref 3.5–5.3)
Sodium: 142 mmol/L (ref 135–146)
Total Bilirubin: 0.3 mg/dL (ref 0.2–1.2)
Total Protein: 6.4 g/dL (ref 6.1–8.1)
eGFR: 101 mL/min/1.73m2 (ref >=60)

## 2024-10-16 LAB — HEMOGLOBIN A1C
Hgb A1c MFr Bld: 5.8 % — ABNORMAL HIGH (ref <5.7)
Mean Plasma Glucose: 120 mg/dL
eAG (mmol/L): 6.6 mmol/L

## 2024-10-16 LAB — LIPID PANEL
Cholesterol: 177 mg/dL (ref <200)
HDL: 55 mg/dL (ref >=50)
LDL Cholesterol (Calc): 108 mg/dL — ABNORMAL HIGH
Non-HDL Cholesterol (Calc): 122 mg/dL (ref <130)
Total CHOL/HDL Ratio: 3.2 (calc) (ref <5.0)
Triglycerides: 53 mg/dL (ref <150)

## 2024-12-25 ENCOUNTER — Other Ambulatory Visit: Payer: Self-pay | Admitting: Nurse Practitioner

## 2024-12-25 DIAGNOSIS — Z1231 Encounter for screening mammogram for malignant neoplasm of breast: Secondary | ICD-10-CM

## 2025-01-06 ENCOUNTER — Ambulatory Visit
